# Patient Record
Sex: Female | Born: 1985 | Race: Asian | Hispanic: No | Marital: Married | Smoking: Never smoker
Health system: Southern US, Community
[De-identification: ages and names within clinical notes are randomized; demographics above are authoritative.]

## PROBLEM LIST (undated history)

## (undated) DIAGNOSIS — K649 Unspecified hemorrhoids: Secondary | ICD-10-CM

## (undated) DIAGNOSIS — B019 Varicella without complication: Secondary | ICD-10-CM

## (undated) DIAGNOSIS — Z789 Other specified health status: Secondary | ICD-10-CM

## (undated) DIAGNOSIS — L709 Acne, unspecified: Secondary | ICD-10-CM

---

## 2010-03-26 ENCOUNTER — Inpatient Hospital Stay (HOSPITAL_COMMUNITY): Admission: AD | Admit: 2010-03-26 | Discharge: 2010-03-26 | Payer: Self-pay | Admitting: Obstetrics and Gynecology

## 2010-08-02 ENCOUNTER — Inpatient Hospital Stay (HOSPITAL_COMMUNITY): Admission: AD | Admit: 2010-08-02 | Discharge: 2010-08-02 | Payer: Self-pay | Admitting: Obstetrics and Gynecology

## 2010-08-08 ENCOUNTER — Inpatient Hospital Stay (HOSPITAL_COMMUNITY): Admission: AD | Admit: 2010-08-08 | Discharge: 2010-08-08 | Payer: Self-pay | Admitting: Obstetrics and Gynecology

## 2010-08-15 ENCOUNTER — Inpatient Hospital Stay (HOSPITAL_COMMUNITY): Admission: RE | Admit: 2010-08-15 | Discharge: 2010-08-18 | Payer: Self-pay | Admitting: Obstetrics and Gynecology

## 2011-01-04 LAB — CROSSMATCH
ABO/RH(D): A POS
Unit division: 0
Unit division: 0

## 2011-01-04 LAB — CBC
HCT: 17.9 % — ABNORMAL LOW (ref 36.0–46.0)
HCT: 36.1 % (ref 36.0–46.0)
Hemoglobin: 12.4 g/dL (ref 12.0–15.0)
Hemoglobin: 6 g/dL — CL (ref 12.0–15.0)
MCH: 32.3 pg (ref 26.0–34.0)
MCHC: 34.3 g/dL (ref 30.0–36.0)
MCHC: 34.5 g/dL (ref 30.0–36.0)
MCV: 94.1 fL (ref 78.0–100.0)
MCV: 95.2 fL (ref 78.0–100.0)
MCV: 95.5 fL (ref 78.0–100.0)
Platelets: 131 10*3/uL — ABNORMAL LOW (ref 150–400)
Platelets: 169 10*3/uL (ref 150–400)
RDW: 14.6 % (ref 11.5–15.5)
WBC: 12 10*3/uL — ABNORMAL HIGH (ref 4.0–10.5)

## 2011-01-04 LAB — SURGICAL PCR SCREEN
MRSA, PCR: NEGATIVE
Staphylococcus aureus: NEGATIVE

## 2011-01-09 LAB — WET PREP, GENITAL

## 2014-10-23 HISTORY — PX: BREAST ENHANCEMENT SURGERY: SHX7

## 2015-03-26 LAB — OB RESULTS CONSOLE GC/CHLAMYDIA
Chlamydia: NEGATIVE
Gonorrhea: NEGATIVE

## 2015-05-11 LAB — OB RESULTS CONSOLE ANTIBODY SCREEN: Antibody Screen: NEGATIVE

## 2015-05-11 LAB — OB RESULTS CONSOLE RUBELLA ANTIBODY, IGM: RUBELLA: IMMUNE

## 2015-05-11 LAB — OB RESULTS CONSOLE ABO/RH: RH TYPE: POSITIVE

## 2015-05-11 LAB — OB RESULTS CONSOLE HIV ANTIBODY (ROUTINE TESTING): HIV: NONREACTIVE

## 2015-05-11 LAB — OB RESULTS CONSOLE HEPATITIS B SURFACE ANTIGEN: Hepatitis B Surface Ag: NEGATIVE

## 2015-08-10 LAB — OB RESULTS CONSOLE RPR: RPR: NONREACTIVE

## 2015-10-08 LAB — OB RESULTS CONSOLE GBS: GBS: NEGATIVE

## 2015-11-02 ENCOUNTER — Other Ambulatory Visit: Payer: Self-pay | Admitting: Obstetrics and Gynecology

## 2015-11-03 ENCOUNTER — Encounter (HOSPITAL_COMMUNITY): Payer: Self-pay

## 2015-11-03 NOTE — Patient Instructions (Signed)
Your procedure is scheduled on:  Monday, Jan. 16, 2017  [facility] through [facility] at:  10:30 A.M.  Pick up the phone at the desk and dial [phone].  Call this number if you have problems the morning of surgery: [phone].  Remember: Do NOT eat food:  After Midnight Sunday Do NOT drink clear liquids after:  8:00 A.M. Day of surgery Take these medicines the morning of surgery with a SIP OF WATER:  None  Do NOT wear jewelry (body piercing), metal hair clips/bobby pins, or nail polish. Do NOT wear lotions, powders, or perfumes.  You may wear deoderant. Do NOT shave for 48 hours prior to surgery. Do NOT bring valuables to the hospital.  Leave suitcase in car.  After surgery it may be brought to your room.  For patients admitted to the hospital, checkout time is 11:00 AM the day of discharge.

## 2015-11-05 ENCOUNTER — Inpatient Hospital Stay (HOSPITAL_COMMUNITY): Payer: BLUE CROSS/BLUE SHIELD | Admitting: Anesthesiology

## 2015-11-05 ENCOUNTER — Encounter (HOSPITAL_COMMUNITY): Payer: Self-pay | Admitting: *Deleted

## 2015-11-05 ENCOUNTER — Encounter (HOSPITAL_COMMUNITY)
Admission: AD | Disposition: A | Discharge status: Home / Self Care | Payer: Self-pay | Source: Ambulatory Visit | Attending: Obstetrics and Gynecology

## 2015-11-05 ENCOUNTER — Inpatient Hospital Stay (HOSPITAL_COMMUNITY)
Admission: RE | Admit: 2015-11-05 | Discharge: 2015-11-05 | Disposition: A | Discharge status: Home / Self Care | Payer: Self-pay | Source: Ambulatory Visit

## 2015-11-05 ENCOUNTER — Inpatient Hospital Stay (HOSPITAL_COMMUNITY)
Admission: AD | Admit: 2015-11-05 | Discharge: 2015-11-07 | DRG: 766 | Disposition: A | Discharge status: Home / Self Care | Payer: BLUE CROSS/BLUE SHIELD | Source: Ambulatory Visit | Attending: Obstetrics and Gynecology | Admitting: Obstetrics and Gynecology

## 2015-11-05 DIAGNOSIS — O4292 Full-term premature rupture of membranes, unspecified as to length of time between rupture and onset of labor: Secondary | ICD-10-CM | POA: Diagnosis present

## 2015-11-05 DIAGNOSIS — O34211 Maternal care for low transverse scar from previous cesarean delivery: Secondary | ICD-10-CM | POA: Diagnosis present

## 2015-11-05 DIAGNOSIS — Z3A39 39 weeks gestation of pregnancy: Secondary | ICD-10-CM

## 2015-11-05 DIAGNOSIS — Z302 Encounter for sterilization: Secondary | ICD-10-CM | POA: Diagnosis not present

## 2015-11-05 HISTORY — DX: Varicella without complication: B01.9

## 2015-11-05 HISTORY — DX: Unspecified hemorrhoids: K64.9

## 2015-11-05 HISTORY — DX: Acne, unspecified: L70.9

## 2015-11-05 HISTORY — DX: Other specified health status: Z78.9

## 2015-11-05 LAB — CBC
HCT: 36.5 % (ref 36.0–46.0)
Hemoglobin: 12 g/dL (ref 12.0–15.0)
MCH: 30.7 pg (ref 26.0–34.0)
MCHC: 32.9 g/dL (ref 30.0–36.0)
MCV: 93.4 fL (ref 78.0–100.0)
PLATELETS: 205 10*3/uL (ref 150–400)
RBC: 3.91 MIL/uL (ref 3.87–5.11)
RDW: 13.8 % (ref 11.5–15.5)
WBC: 9 10*3/uL (ref 4.0–10.5)

## 2015-11-05 LAB — TYPE AND SCREEN
ABO/RH(D): A POS
ANTIBODY SCREEN: NEGATIVE

## 2015-11-05 SURGERY — Surgical Case
Anesthesia: Spinal

## 2015-11-05 MED ORDER — DIBUCAINE 1 % RE OINT: 1.0000 "application " | TOPICAL_OINTMENT | RECTAL | Status: DC | PRN

## 2015-11-05 MED ORDER — SODIUM CHLORIDE 0.9 % IR SOLN
Status: DC | PRN
Start: 2015-11-05 — End: 2015-11-05
  Administered 2015-11-05: 1000 mL

## 2015-11-05 MED ORDER — FLEET ENEMA 7-19 GM/118ML RE ENEM: 1.0000 | ENEMA | RECTAL | Status: DC | PRN

## 2015-11-05 MED ORDER — LANOLIN HYDROUS EX OINT: 1.0000 "application " | TOPICAL_OINTMENT | CUTANEOUS | Status: DC | PRN

## 2015-11-05 MED ORDER — PHENYLEPHRINE 40 MCG/ML (10ML) SYRINGE FOR IV PUSH (FOR BLOOD PRESSURE SUPPORT)
PREFILLED_SYRINGE | INTRAVENOUS | Status: AC
  Filled 2015-11-05: qty 10

## 2015-11-05 MED ORDER — PRENATAL MULTIVITAMIN CH
1.0000 | ORAL_TABLET | Freq: Every day | ORAL | Status: DC
  Administered 2015-11-06 – 2015-11-07 (×2): 1 via ORAL
  Filled 2015-11-05 (×2): qty 1

## 2015-11-05 MED ORDER — OXYTOCIN 10 UNIT/ML IJ SOLN: 2.5000 [IU]/h | INTRAVENOUS | Status: AC

## 2015-11-05 MED ORDER — IBUPROFEN 600 MG PO TABS
600.0000 mg | ORAL_TABLET | Freq: Four times a day (QID) | ORAL | Status: DC
  Administered 2015-11-06 – 2015-11-07 (×7): 600 mg via ORAL
  Filled 2015-11-05 (×7): qty 1

## 2015-11-05 MED ORDER — NALBUPHINE HCL 10 MG/ML IJ SOLN: 5.0000 mg | Freq: Once | INTRAMUSCULAR | Status: DC | PRN

## 2015-11-05 MED ORDER — LACTATED RINGERS IV SOLN: 500.0000 mL | INTRAVENOUS | Status: DC | PRN

## 2015-11-05 MED ORDER — METOCLOPRAMIDE HCL 5 MG/ML IJ SOLN
INTRAMUSCULAR | Status: AC
  Filled 2015-11-05: qty 2

## 2015-11-05 MED ORDER — CITRIC ACID-SODIUM CITRATE 334-500 MG/5ML PO SOLN
30.0000 mL | ORAL | Status: DC | PRN
  Administered 2015-11-05: 30 mL via ORAL
  Filled 2015-11-05: qty 15

## 2015-11-05 MED ORDER — MORPHINE SULFATE (PF) 0.5 MG/ML IJ SOLN
INTRAMUSCULAR | Status: AC
  Filled 2015-11-05: qty 10

## 2015-11-05 MED ORDER — ONDANSETRON HCL 4 MG/2ML IJ SOLN
INTRAMUSCULAR | Status: AC
  Filled 2015-11-05: qty 2

## 2015-11-05 MED ORDER — TETANUS-DIPHTH-ACELL PERTUSSIS 5-2.5-18.5 LF-MCG/0.5 IM SUSP: 0.5000 mL | Freq: Once | INTRAMUSCULAR | Status: DC

## 2015-11-05 MED ORDER — NALBUPHINE HCL 10 MG/ML IJ SOLN
5.0000 mg | Freq: Once | INTRAMUSCULAR | Status: DC | PRN
  Filled 2015-11-05: qty 1

## 2015-11-05 MED ORDER — LACTATED RINGERS IV SOLN
INTRAVENOUS | Status: DC
  Administered 2015-11-05: 12:00:00 via INTRAVENOUS

## 2015-11-05 MED ORDER — SIMETHICONE 80 MG PO CHEW
80.0000 mg | CHEWABLE_TABLET | Freq: Three times a day (TID) | ORAL | Status: DC
  Administered 2015-11-06 – 2015-11-07 (×3): 80 mg via ORAL
  Filled 2015-11-05 (×3): qty 1

## 2015-11-05 MED ORDER — ACETAMINOPHEN 325 MG PO TABS
650.0000 mg | ORAL_TABLET | ORAL | Status: DC | PRN
  Administered 2015-11-06: 650 mg via ORAL
  Filled 2015-11-05: qty 2

## 2015-11-05 MED ORDER — ONDANSETRON HCL 4 MG/2ML IJ SOLN: 4.0000 mg | Freq: Three times a day (TID) | INTRAMUSCULAR | Status: DC | PRN

## 2015-11-05 MED ORDER — KETOROLAC TROMETHAMINE 30 MG/ML IJ SOLN: 30.0000 mg | Freq: Four times a day (QID) | INTRAMUSCULAR | Status: AC | PRN

## 2015-11-05 MED ORDER — NALOXONE HCL 2 MG/2ML IJ SOSY: 1.0000 ug/kg/h | PREFILLED_SYRINGE | INTRAVENOUS | Status: DC | PRN

## 2015-11-05 MED ORDER — NALBUPHINE HCL 10 MG/ML IJ SOLN
5.0000 mg | INTRAMUSCULAR | Status: DC | PRN
  Administered 2015-11-05 – 2015-11-06 (×2): 5 mg via SUBCUTANEOUS
  Filled 2015-11-05: qty 1

## 2015-11-05 MED ORDER — SCOPOLAMINE 1 MG/3DAYS TD PT72
MEDICATED_PATCH | TRANSDERMAL | Status: DC | PRN
  Administered 2015-11-05: 1 via TRANSDERMAL

## 2015-11-05 MED ORDER — KETOROLAC TROMETHAMINE 30 MG/ML IJ SOLN
30.0000 mg | Freq: Four times a day (QID) | INTRAMUSCULAR | Status: AC | PRN
  Administered 2015-11-05: 30 mg via INTRAMUSCULAR

## 2015-11-05 MED ORDER — OXYTOCIN 40 UNITS IN LACTATED RINGERS INFUSION - SIMPLE MED
INTRAVENOUS | Status: DC | PRN
  Administered 2015-11-05: 40 [IU] via INTRAVENOUS

## 2015-11-05 MED ORDER — OXYCODONE-ACETAMINOPHEN 5-325 MG PO TABS
1.0000 | ORAL_TABLET | ORAL | Status: DC | PRN
  Administered 2015-11-06 (×2): 1 via ORAL
  Filled 2015-11-05 (×2): qty 1

## 2015-11-05 MED ORDER — ZOLPIDEM TARTRATE 5 MG PO TABS: 5.0000 mg | ORAL_TABLET | Freq: Every evening | ORAL | Status: DC | PRN

## 2015-11-05 MED ORDER — OXYCODONE-ACETAMINOPHEN 5-325 MG PO TABS
2.0000 | ORAL_TABLET | ORAL | Status: DC | PRN
  Administered 2015-11-06 – 2015-11-07 (×4): 2 via ORAL
  Filled 2015-11-05 (×4): qty 2

## 2015-11-05 MED ORDER — CEFAZOLIN SODIUM-DEXTROSE 2-3 GM-% IV SOLR
2.0000 g | INTRAVENOUS | Status: AC
  Administered 2015-11-05: 2 g via INTRAVENOUS

## 2015-11-05 MED ORDER — DIPHENHYDRAMINE HCL 50 MG/ML IJ SOLN: 12.5000 mg | INTRAMUSCULAR | Status: DC | PRN

## 2015-11-05 MED ORDER — KETOROLAC TROMETHAMINE 30 MG/ML IJ SOLN
INTRAMUSCULAR | Status: AC
  Filled 2015-11-05: qty 1

## 2015-11-05 MED ORDER — FENTANYL CITRATE (PF) 100 MCG/2ML IJ SOLN
INTRAMUSCULAR | Status: AC
  Filled 2015-11-05: qty 2

## 2015-11-05 MED ORDER — PHENYLEPHRINE 8 MG IN D5W 100 ML (0.08MG/ML) PREMIX OPTIME
INJECTION | INTRAVENOUS | Status: AC
  Filled 2015-11-05: qty 100

## 2015-11-05 MED ORDER — MENTHOL 3 MG MT LOZG: 1.0000 | LOZENGE | OROMUCOSAL | Status: DC | PRN

## 2015-11-05 MED ORDER — LIDOCAINE HCL (PF) 1 % IJ SOLN: 30.0000 mL | INTRAMUSCULAR | Status: DC | PRN

## 2015-11-05 MED ORDER — OXYCODONE-ACETAMINOPHEN 5-325 MG PO TABS: 2.0000 | ORAL_TABLET | ORAL | Status: DC | PRN

## 2015-11-05 MED ORDER — NALOXONE HCL 0.4 MG/ML IJ SOLN: 0.4000 mg | INTRAMUSCULAR | Status: DC | PRN

## 2015-11-05 MED ORDER — METOCLOPRAMIDE HCL 5 MG/ML IJ SOLN
INTRAMUSCULAR | Status: DC | PRN
  Administered 2015-11-05: 5 mg via INTRAVENOUS

## 2015-11-05 MED ORDER — HYDROMORPHONE HCL 1 MG/ML IJ SOLN: 0.2500 mg | INTRAMUSCULAR | Status: DC | PRN

## 2015-11-05 MED ORDER — MORPHINE SULFATE (PF) 0.5 MG/ML IJ SOLN
INTRAMUSCULAR | Status: DC | PRN
  Administered 2015-11-05: .2 mg via EPIDURAL

## 2015-11-05 MED ORDER — DIPHENHYDRAMINE HCL 25 MG PO CAPS: 25.0000 mg | ORAL_CAPSULE | Freq: Four times a day (QID) | ORAL | Status: DC | PRN

## 2015-11-05 MED ORDER — CEFAZOLIN SODIUM-DEXTROSE 2-3 GM-% IV SOLR
INTRAVENOUS | Status: AC
  Filled 2015-11-05: qty 50

## 2015-11-05 MED ORDER — LACTATED RINGERS IV SOLN
INTRAVENOUS | Status: DC
  Administered 2015-11-06 (×2): via INTRAVENOUS

## 2015-11-05 MED ORDER — ONDANSETRON HCL 4 MG/2ML IJ SOLN
4.0000 mg | Freq: Four times a day (QID) | INTRAMUSCULAR | Status: DC | PRN
  Administered 2015-11-05: 4 mg via INTRAVENOUS

## 2015-11-05 MED ORDER — IBUPROFEN 600 MG PO TABS
600.0000 mg | ORAL_TABLET | Freq: Four times a day (QID) | ORAL | Status: DC | PRN
Start: 2015-11-05 — End: 2015-11-05

## 2015-11-05 MED ORDER — OXYTOCIN BOLUS FROM INFUSION
500.0000 mL | INTRAVENOUS | Status: DC
Start: 2015-11-05 — End: 2015-11-05

## 2015-11-05 MED ORDER — FENTANYL CITRATE (PF) 100 MCG/2ML IJ SOLN
INTRAMUSCULAR | Status: DC | PRN
  Administered 2015-11-05: 20 ug via INTRAVENOUS

## 2015-11-05 MED ORDER — PROMETHAZINE HCL 25 MG/ML IJ SOLN: 6.2500 mg | INTRAMUSCULAR | Status: DC | PRN

## 2015-11-05 MED ORDER — LACTATED RINGERS IV SOLN: 2.5000 [IU]/h | INTRAVENOUS | Status: DC

## 2015-11-05 MED ORDER — LACTATED RINGERS IV SOLN
INTRAVENOUS | Status: DC | PRN
Start: 2015-11-05 — End: 2015-11-05
  Administered 2015-11-05: 17:00:00 via INTRAVENOUS

## 2015-11-05 MED ORDER — OXYCODONE-ACETAMINOPHEN 5-325 MG PO TABS: 1.0000 | ORAL_TABLET | ORAL | Status: DC | PRN

## 2015-11-05 MED ORDER — OXYTOCIN 10 UNIT/ML IJ SOLN
INTRAMUSCULAR | Status: AC
  Filled 2015-11-05: qty 4

## 2015-11-05 MED ORDER — BUPIVACAINE IN DEXTROSE 0.75-8.25 % IT SOLN
INTRATHECAL | Status: DC | PRN
  Administered 2015-11-05: 1.5 mL via INTRATHECAL

## 2015-11-05 MED ORDER — DIPHENHYDRAMINE HCL 25 MG PO CAPS: 25.0000 mg | ORAL_CAPSULE | ORAL | Status: DC | PRN

## 2015-11-05 MED ORDER — ACETAMINOPHEN 325 MG PO TABS: 650.0000 mg | ORAL_TABLET | ORAL | Status: DC | PRN

## 2015-11-05 MED ORDER — PHENYLEPHRINE 8 MG IN D5W 100 ML (0.08MG/ML) PREMIX OPTIME
INJECTION | INTRAVENOUS | Status: DC | PRN
  Administered 2015-11-05: 60 ug/min via INTRAVENOUS

## 2015-11-05 MED ORDER — LACTATED RINGERS IV SOLN
INTRAVENOUS | Status: DC | PRN
  Administered 2015-11-05 (×3): via INTRAVENOUS

## 2015-11-05 MED ORDER — SIMETHICONE 80 MG PO CHEW
80.0000 mg | CHEWABLE_TABLET | ORAL | Status: DC
  Administered 2015-11-06: 80 mg via ORAL
  Filled 2015-11-05: qty 1

## 2015-11-05 MED ORDER — SIMETHICONE 80 MG PO CHEW: 80.0000 mg | CHEWABLE_TABLET | ORAL | Status: DC | PRN

## 2015-11-05 MED ORDER — SODIUM CHLORIDE 0.9 % IJ SOLN: 3.0000 mL | INTRAMUSCULAR | Status: DC | PRN

## 2015-11-05 MED ORDER — MEPERIDINE HCL 25 MG/ML IJ SOLN: 6.2500 mg | INTRAMUSCULAR | Status: DC | PRN

## 2015-11-05 MED ORDER — SCOPOLAMINE 1 MG/3DAYS TD PT72: 1.0000 | MEDICATED_PATCH | Freq: Once | TRANSDERMAL | Status: DC

## 2015-11-05 MED ORDER — WITCH HAZEL-GLYCERIN EX PADS: 1.0000 "application " | MEDICATED_PAD | CUTANEOUS | Status: DC | PRN

## 2015-11-05 MED ORDER — NALBUPHINE HCL 10 MG/ML IJ SOLN: 5.0000 mg | INTRAMUSCULAR | Status: DC | PRN

## 2015-11-05 MED ORDER — SENNOSIDES-DOCUSATE SODIUM 8.6-50 MG PO TABS
2.0000 | ORAL_TABLET | ORAL | Status: DC
  Administered 2015-11-06: 2 via ORAL
  Filled 2015-11-05: qty 2

## 2015-11-05 SURGICAL SUPPLY — 34 items
CLAMP CORD UMBIL (MISCELLANEOUS) IMPLANT
CLIP FILSHIE TUBAL LIGA STRL (Clip) ×3 IMPLANT
CLOTH BEACON ORANGE TIMEOUT ST (SAFETY) ×3 IMPLANT
DRAPE SHEET LG 3/4 BI-LAMINATE (DRAPES) IMPLANT
DRSG OPSITE POSTOP 4X10 (GAUZE/BANDAGES/DRESSINGS) ×3 IMPLANT
DURAPREP 26ML APPLICATOR (WOUND CARE) ×3 IMPLANT
ELECT REM PT RETURN 9FT ADLT (ELECTROSURGICAL) ×3
ELECTRODE REM PT RTRN 9FT ADLT (ELECTROSURGICAL) ×1 IMPLANT
EXTRACTOR VACUUM M CUP 4 TUBE (SUCTIONS) IMPLANT
EXTRACTOR VACUUM M CUP 4' TUBE (SUCTIONS)
GLOVE BIOGEL PI IND STRL 6.5 (GLOVE) ×1 IMPLANT
GLOVE BIOGEL PI IND STRL 7.0 (GLOVE) ×1 IMPLANT
GLOVE BIOGEL PI INDICATOR 6.5 (GLOVE) ×2
GLOVE BIOGEL PI INDICATOR 7.0 (GLOVE) ×2
GLOVE ECLIPSE 6.5 STRL STRAW (GLOVE) ×3 IMPLANT
GOWN STRL REUS W/TWL LRG LVL3 (GOWN DISPOSABLE) ×6 IMPLANT
KIT ABG SYR 3ML LUER SLIP (SYRINGE) IMPLANT
NEEDLE HYPO 25X5/8 SAFETYGLIDE (NEEDLE) IMPLANT
NS IRRIG 1000ML POUR BTL (IV SOLUTION) ×3 IMPLANT
PACK C SECTION WH (CUSTOM PROCEDURE TRAY) ×3 IMPLANT
PAD ABD 7.5X8 STRL (GAUZE/BANDAGES/DRESSINGS) IMPLANT
PAD OB MATERNITY 4.3X12.25 (PERSONAL CARE ITEMS) ×3 IMPLANT
PENCIL SMOKE EVAC W/HOLSTER (ELECTROSURGICAL) ×3 IMPLANT
RTRCTR C-SECT PINK 25CM LRG (MISCELLANEOUS) ×3 IMPLANT
SUT CHROMIC 0 CTX 36 (SUTURE) ×3 IMPLANT
SUT MON AB 2-0 CT1 27 (SUTURE) ×3 IMPLANT
SUT MON AB 4-0 PS1 27 (SUTURE) IMPLANT
SUT PDS AB 0 CTX 60 (SUTURE) IMPLANT
SUT PLAIN 2 0 XLH (SUTURE) IMPLANT
SUT VIC AB 0 CTX 36 (SUTURE) ×8
SUT VIC AB 0 CTX36XBRD ANBCTRL (SUTURE) ×4 IMPLANT
SUT VIC AB 4-0 KS 27 (SUTURE) IMPLANT
TOWEL OR 17X24 6PK STRL BLUE (TOWEL DISPOSABLE) ×3 IMPLANT
TRAY FOLEY CATH SILVER 14FR (SET/KITS/TRAYS/PACK) ×3 IMPLANT

## 2015-11-05 NOTE — Anesthesia Procedure Notes (Signed)
Spinal Patient location during procedure: OB Staffing Anesthesiologist: Franne Grip Performed by: anesthesiologist  Preanesthetic Checklist Completed: patient identified, site marked, surgical consent, pre-op evaluation, timeout performed, IV checked, risks and benefits discussed and monitors and equipment checked Spinal Block Patient position: sitting Prep: Betadine and ChloraPrep Patient monitoring: heart rate, continuous pulse ox and blood pressure Approach: midline Location: L3-4 Injection technique: single-shot Needle Needle type: Pencan  Needle gauge: 24 G Needle length: 9 cm Additional Notes Expiration date of kit checked and confirmed. Patient tolerated procedure well, without complications. CSF clear. No paresthesia.

## 2015-11-05 NOTE — Transfer of Care (Signed)
Immediate Anesthesia Transfer of Care Note  Patient: Rebecca Rubio  Procedure(s) Performed: Procedure(s): CESAREAN SECTION WITH BILATERAL TUBAL LIGATION (N/A)  Patient Location: PACU  Anesthesia Type:Spinal  Level of Consciousness: awake, alert  and oriented  Airway & Oxygen Therapy: Patient Spontanous Breathing  Post-op Assessment: Report given to RN and Post -op Vital signs reviewed and stable  Post vital signs: Reviewed and stable  Last Vitals:  Filed Vitals:   11/05/15 1500 11/05/15 1749  BP: 118/72   Pulse: 82   Temp:  36.8 C  Resp: 18     Complications: No apparent anesthesia complications

## 2015-11-05 NOTE — Anesthesia Preprocedure Evaluation (Signed)
Anesthesia Evaluation  Patient identified by MRN, date of birth, ID band Patient awake    Reviewed: Allergy & Precautions, NPO status , Patient's Chart, lab work & pertinent test results  Airway Mallampati: II  TM Distance: >3 FB Neck ROM: Full    Dental no notable dental hx.    Pulmonary neg pulmonary ROS,    Pulmonary exam normal breath sounds clear to auscultation       Cardiovascular negative cardio ROS Normal cardiovascular exam Rhythm:Regular Rate:Normal     Neuro/Psych negative neurological ROS  negative psych ROS   GI/Hepatic negative GI ROS, Neg liver ROS,   Endo/Other  negative endocrine ROS  Renal/GU negative Renal ROS  negative genitourinary   Musculoskeletal negative musculoskeletal ROS (+)   Abdominal   Peds negative pediatric ROS (+)  Hematology negative hematology ROS (+)   Anesthesia Other Findings   Reproductive/Obstetrics negative OB ROS                             Anesthesia Physical Anesthesia Plan  ASA: II  Anesthesia Plan: Spinal   Post-op Pain Management:    Induction: Intravenous  Airway Management Planned:   Additional Equipment:   Intra-op Plan:   Post-operative Plan: Extubation in OR  Informed Consent: I have reviewed the patients History and Physical, chart, labs and discussed the procedure including the risks, benefits and alternatives for the proposed anesthesia with the patient or authorized representative who has indicated his/her understanding and acceptance.   Dental advisory given  Plan Discussed with: CRNA  Anesthesia Plan Comments:         Anesthesia Quick Evaluation

## 2015-11-05 NOTE — H&P (Signed)
30 y.o. [id number] 31  G2P1001 comes in from office with rupture membranes.  Otherwise has good fetal movement and no bleeding.  She ate at 8am.  She has a repeat c/s with BTL planned for Monday.  Past Medical History  Diagnosis Date  . Chicken pox   . Acne   . Hemorrhoids   . Medical history non-contributory     Past Surgical History  Procedure Laterality Date  . Cesarean section      OB History  Gravida Para Term Preterm AB SAB TAB Ectopic Multiple Living  2 1 1       1     # Outcome Date GA Lbr Len/2nd Weight Sex Delivery Anes PTL Lv  2 Current           1 Term               Social History   Social History  . Marital Status: Married    Spouse Name: N/A  . Number of Children: N/A  . Years of Education: N/A   Occupational History  . Not on file.   Social History Main Topics  . Smoking status: Never Smoker   . Smokeless tobacco: Not on file  . Alcohol Use: No  . Drug Use: No  . Sexual Activity: Not on file   Other Topics Concern  . Not on file   Social History Narrative   Review of patient's allergies indicates no known allergies.    Prenatal Transfer Tool  Maternal Diabetes: No Genetic Screening: Declined Maternal Ultrasounds/Referrals: Normal Fetal Ultrasounds or other Referrals:  None Maternal Substance Abuse:  No Significant Maternal Medications:  None Significant Maternal Lab Results: Lab values include: Group B Strep negative  Other PNC: uncomplicated.    Filed Vitals:   11/05/15 1123  BP: 125/79  Pulse: 95  Temp: 98.5 F (36.9 C)  Resp: 18     Lungs/Cor:  NAD Abdomen:  soft, gravid Ex:  no cords, erythema SVE:  closed FHTs:  135, good STV, NST R Toco:  irreg   A/P   Admit for repeat c/s  Unless active labor will plan for c/s with BTL when at least 8 hrs from meal.  Pre-op Ancef 2g  Other routine pre-op care  GBS Neg  Pradeep Beaubrun

## 2015-11-05 NOTE — Lactation Note (Signed)
This note was copied from the chart of Rebecca Krystal Bach. Lactation Consultation Note Initial visit at 5 hours of age.  Mom has baby latched in cradle hold with body twisted.  Encouraged mom to roll baby belly to chest for ear, shoulder and hips to align.  Baby has strong rhythmic sucking appears "chompy" mom declines pain with latch.  Encouraged mom to allow cheeks chin and nose touch the breast for a deep latch.  Mom reports she is able to hand express with colostrum visible prior to latch.  [facility] resources given and discussed.  Encouraged to feed with early cues on demand.  Early newborn behavior discussed.  Mom to call for assist as needed.    Patient Name: Rebecca Rubio ZOXWR'UToday's Date: 11/05/2015 Reason for consult: Initial assessment   Maternal Data Has patient been taught Hand Expression?: Yes Does the patient have breastfeeding experience prior to this delivery?: Yes  Feeding Feeding Type: Breast Fed Length of feed:  (20 minutes and continues)  LATCH Score/Interventions Latch: Grasps breast easily, tongue down, lips flanged, rhythmical sucking.  Audible Swallowing: A few with stimulation Intervention(s): Skin to skin;Hand expression;Alternate breast massage  Type of Nipple: Everted at rest and after stimulation  Comfort (Breast/Nipple): Soft / non-tender     Hold (Positioning): Assistance needed to correctly position infant at breast and maintain latch.  LATCH Score: 8  Lactation Tools Discussed/Used     Consult Status Consult Status: Follow-up Date: 11/06/15 Follow-up type: In-patient    [name], [name] 11/05/2015, 10:27 PM

## 2015-11-05 NOTE — Brief Op Note (Signed)
11/05/2015  5:42 PM  PATIENT:  Rebecca Rubio  30 y.o. female  PRE-OPERATIVE DIAGNOSIS:  Elective Repeat, Desires Sterilization, SROM  POST-OPERATIVE DIAGNOSIS:  Elective Repeat, Desires Sterilization, SROM  PROCEDURE:  Procedure(s): CESAREAN SECTION WITH BILATERAL TUBAL LIGATION (N/A)  SURGEON:  Surgeon(s) and Role:    Philip Aspen* Carrie Usery, DO - Primary  ANESTHESIA:   spinal  EBL:  Total I/O In: 3300 [I.V.:3300] Out: 1100 [Urine:350; Blood:750]  SPECIMEN:  Source of Specimen:  cord blood  DISPOSITION OF SPECIMEN:  N/A  COUNTS:  YES  PLAN OF CARE: Admit to inpatient   PATIENT DISPOSITION:  PACU - hemodynamically stable.   Delay start of Pharmacological VTE agent (>24hrs) due to surgical blood loss or risk of bleeding: not applicable  FINDINGS: female infant, cephalic presentation, APGARS 8/9, wt pending

## 2015-11-06 LAB — CBC
HEMATOCRIT: 31 % — AB (ref 36.0–46.0)
HEMOGLOBIN: 10.1 g/dL — AB (ref 12.0–15.0)
MCH: 30.6 pg (ref 26.0–34.0)
MCHC: 32.6 g/dL (ref 30.0–36.0)
MCV: 93.9 fL (ref 78.0–100.0)
Platelets: 171 10*3/uL (ref 150–400)
RBC: 3.3 MIL/uL — ABNORMAL LOW (ref 3.87–5.11)
RDW: 13.8 % (ref 11.5–15.5)
WBC: 11.8 10*3/uL — ABNORMAL HIGH (ref 4.0–10.5)

## 2015-11-06 LAB — RPR: RPR: NONREACTIVE

## 2015-11-06 NOTE — Anesthesia Postprocedure Evaluation (Signed)
Anesthesia Post Note  Patient: Rebecca Rubio  Procedure(s) Performed: Procedure(s) (LRB): CESAREAN SECTION WITH BILATERAL TUBAL LIGATION (N/A)  Patient location during evaluation: Mother Baby Anesthesia Type: Spinal Level of consciousness: awake, awake and alert, oriented and patient cooperative Pain management: pain level controlled Vital Signs Assessment: post-procedure vital signs reviewed and stable Respiratory status: spontaneous breathing, nonlabored ventilation and respiratory function stable Cardiovascular status: stable Postop Assessment: no headache, no backache, patient able to bend at knees and no signs of nausea or vomiting Anesthetic complications: no    Last Vitals:  Filed Vitals:   11/06/15 0145 11/06/15 0528  BP: 118/54 115/54  Pulse: 90 79  Temp:  36.8 C  Resp:  18    Last Pain:  Filed Vitals:   11/06/15 0641  PainSc: 0-No pain                 Dominique Ressel L

## 2015-11-06 NOTE — Lactation Note (Addendum)
This note was copied from the chart of Rebecca Rubio. Lactation Consultation Note: Experienced BF mom reports baby has been nursing well today. Had some bleeding from nipple yesterday but thought she did not have the baby on the breast right. Doing better today. Baby last fed about 1 hour ago for 20 min- asleep in dad's arms at present. Last LS 8 by RN. No questions at present. To call prn  Patient Name: Rebecca Julianne HandlerWhiskey Dalia ZOXWR'UToday's Date: 11/06/2015 Reason for consult: Follow-up assessment   Maternal Data Formula Feeding for Exclusion: No Does the patient have breastfeeding experience prior to this delivery?: Yes  Feeding  LATCH Score/Interventions  Lactation Tools Discussed/Used     Consult Status Consult Status: PRN    Pamelia HoitWeeks, Pistol Kessenich D 11/06/2015, 3:08 PM

## 2015-11-06 NOTE — Anesthesia Postprocedure Evaluation (Signed)
Anesthesia Post Note  Patient: Rebecca Rubio  Procedure(s) Performed: Procedure(s) (LRB): CESAREAN SECTION WITH BILATERAL TUBAL LIGATION (N/A)  Patient location during evaluation: PACU Anesthesia Type: Spinal Level of consciousness: oriented and awake and alert Pain management: pain level controlled Vital Signs Assessment: post-procedure vital signs reviewed and stable Respiratory status: spontaneous breathing, respiratory function stable and patient connected to nasal cannula oxygen Cardiovascular status: blood pressure returned to baseline and stable Postop Assessment: no headache, no backache and spinal receding Anesthetic complications: no    Last Vitals:  Filed Vitals:   11/06/15 0135 11/06/15 0145  BP: 103/64 118/54  Pulse: 82 90  Temp:    Resp:      Last Pain:  Filed Vitals:   11/06/15 0446  PainSc: Asleep                 [name] J

## 2015-11-06 NOTE — Addendum Note (Signed)
Addendum  created 11/06/15 0721 by Yolonda KidaAlison L Adler Alton, CRNA   Modules edited: Clinical Notes   Clinical Notes:  File: [id number]

## 2015-11-06 NOTE — Progress Notes (Signed)
Patient is eating, ambulating, foley not yet out.  Pain control is good.  Appropriate lochia, no complaints.  Filed Vitals:   11/06/15 0135 11/06/15 0145 11/06/15 0528 11/06/15 0930  BP: 103/64 118/54 115/54 103/45  Pulse: 82 90 79 76  Temp:   98.2 F (36.8 C) 98.7 F (37.1 C)  TempSrc:    Oral  Resp:   18 18  Height:      Weight:      SpO2:   99% 96%    Fundus firm Inc: c/d/i - minimal blood on dressing Ext: no CT  Lab Results  Component Value Date   WBC 11.8* 11/06/2015   HGB 10.1* 11/06/2015   HCT 31.0* 11/06/2015   MCV 93.9 11/06/2015   PLT 171 11/06/2015    --/--/A POS (01/13 1130)  A/P Postop day #1 s/p repeat c/s with BTL Circ with pediatrician Doing well  Routine care.  Expect d/c 1/15.    Philip AspenALLAHAN, Trystian Crisanto

## 2015-11-07 MED ORDER — OXYCODONE-ACETAMINOPHEN 5-325 MG PO TABS: 2.0000 | ORAL_TABLET | ORAL | Status: DC | PRN

## 2015-11-07 NOTE — Discharge Summary (Signed)
Obstetric Discharge Summary Reason for Admission: cesarean section and rupture of membranes Prenatal Procedures: NST Intrapartum Procedures: cesarean: low cervical, transverse and tubal ligation Postpartum Procedures: none Complications-Operative and Postpartum: none HEMOGLOBIN  Date Value Ref Range Status  11/06/2015 10.1* 12.0 - 15.0 g/dL Final   HCT  Date Value Ref Range Status  11/06/2015 31.0* 36.0 - 46.0 % Final    Physical Exam:  General: alert Lochia: appropriate Uterine Fundus: firm Incision: healing well DVT Evaluation: No evidence of DVT seen on physical exam.  Discharge Diagnoses: Term Pregnancy-delivered and history of prior c/s  Discharge Information: Date: 11/07/2015 Activity: pelvic rest Diet: routine Medications: PNV, Ibuprofen and Percocet Condition: stable Instructions: refer to practice specific booklet Discharge to: home Follow-up Information    Follow up with CALLAHAN, SIDNEY, DO. Schedule an appointment as soon as possible for a visit in 1 month.   Specialty:  Obstetrics and Gynecology   Contact information:   [address] [place] [address] [phone]       Newborn Data: Live born female  Birth Weight: 7 lb 6.7 oz (3365 g) APGAR: 8, 9  Home with mother.  Kysha Muralles E 11/07/2015, 9:58 AM

## 2015-11-07 NOTE — Progress Notes (Signed)
Pt without complaints. She would like to go home Plan/ Will discharge.  

## 2015-11-07 NOTE — Lactation Note (Signed)
This note was copied from the chart of Rebecca Rubio. Lactation Consultation Note  Patient Name: Rebecca Rubio WGNFA'OToday's Date: 11/07/2015 Reason for consult: Follow-up assessment Baby is 4042 hours [facility]. Mom reports that BF is going well; that it hurt some in the beginning but now things are fine. Mom reports that she has a personal pump at home (was not sure of brand) & does not have WIC. [facility]. Mom reports no questions at this time.   Maternal Data    Feeding  LATCH Score/Interventions                      Lactation Tools Discussed/Used WIC Program: No   Consult Status Consult Status: Complete    Oneal GroutLaura C Fraida Veldman 11/07/2015, 11:20 AM

## 2015-11-07 NOTE — Op Note (Signed)
NAME:  Rebecca Rubio, Rebecca Rubio            ACCOUNT NO.:  [id number]  MEDICAL RECORD NO.:  [id number]  LOCATION:  9126                          FACILITY:  [facility]  PHYSICIAN:  Philip AspenSidney Amoni Scallan, DO    DATE OF BIRTH:  [birth year]  DATE OF PROCEDURE:  11/05/2015 DATE OF DISCHARGE:                              OPERATIVE REPORT   PREOPERATIVE DIAGNOSIS:  Elective repeat cesarean section with desired permanent sterilization and presentation with spontaneous rupture of membranes.  POSTOPERATIVE DIAGNOSIS:  Elective repeat cesarean section with desired permanent sterilization and presentation with spontaneous rupture of membranes.  PROCEDURE:  Low-transverse cesarean section with bilateral tubal ligation with Filshie clips.  SURGEON:  Philip AspenSidney Kalyn Hofstra, D.O.  ANESTHESIA:  Spinal.  IV FLUIDS:  3300 mL.  URINE OUTPUT:  350 mL.  ESTIMATED BLOOD LOSS:  750 mL.  SPECIMENS:  Cord blood.  COMPLICATIONS:  None.  CONDITION:  Stable to PACU.  FINDINGS:  Female infant with meconium present.  Weight 7 pounds 6 ounces with Apgars of 8 and 9.  She will need adhesions from the anterior uterus involving bladder and anterior abdominal wall.  Normal tubes and ovaries bilaterally.  PROCEDURE IN DETAIL:  The patient was taken to the operating room where spinal anesthesia was administered and found to be adequate.  She was prepped and draped in a normal sterile fashion in dorsal supine position.  A Pfannenstiel skin incision was made with a scalpel and carried down to underlying layer of fascia with Bovie cautery.  The fascia was incised in the midline with the scalpel and extended laterally with Mayo scissors.  Kocher clamps were placed at the superior aspect of the fascial incision, and rectus muscles were dissected off bluntly and sharply.  Kocher clamps were then placed at the anterior aspect of the fascial incision, and again the rectus muscles were dissected off bluntly and sharply.  Hemostat was used to  separate the musculature of the rectus muscles at the midline with identification of the peritoneum.  The peritoneum was entered bluntly and extended slightly by manual lateral traction.  Good visualization of the bladder was noted and dissection of the scarred anterior abdominal wall tissue with rectus muscles was performed carefully to avoid any bladder injury. The peritoneal incision was further extended by lateral traction.  The abdomen was surveyed manually, and no additional scar tissue was found to impede placement of the Alexis self retractor.  The vesicouterine peritoneum was tented with forceps, entered sharply with Metzenbaum scissors, extended laterally, and a bladder flap was developed visually. A low transverse cesarean incision was made and copious meconium came through the incision.  The uterine incision was extended by caudal and cephalic traction.  The infant's head was located easily and delivered through the hysterotomy incision followed by the remainder of the infant's body.  The baby was vigorous, bulb-suctioned.  Cord was clamped and cut, and the infant was handed off to awaiting Neonatology.  Pitocin was started and gentle traction on the umbilical cord with external massage of the uterus delivered the placenta.  The uterine cavity was cleared of all clot and debris, and the uterine incision was closed with Vicryl in a running, locked fashion.  Excellent hemostasis was  noted. The right fallopian tube was identified and elevated with the use of 2 Babcock clamps, followed to its fimbriated end, and a Filshie clip was placed at the mid point.  The left tube was also identified, and a Filshie clip was placed.  On the right side, some minimal bleeding adjacent to the Filshie clip was noted.  A Bovie cautery was used and a loop of chromic suture was placed with excellent hemostasis.  The Alexis self retractor was then removed.  The fascia was reapproximated and closed  with 0 Vicryl in a running fashion.  Subcutaneous tissue was irrigated, dried, and minimal use of Bovie cautery was needed for hemostasis.  Subcutaneous tissue was then reapproximated and closed with 5 interrupted chromic sutures.  Skin was then reapproximated and closed with staples.  The patient tolerated the procedure well.  Sponge, lap, and needle counts were correct x2.  The patient was taken to Recovery in stable condition.          ______________________________ Philip Aspen, DO     [place]/MEDQ  D:  11/06/2015  T:  11/07/2015  Job:  [id number]

## 2015-11-08 ENCOUNTER — Encounter (HOSPITAL_COMMUNITY): Admission: RE | Payer: Self-pay | Source: Ambulatory Visit

## 2015-11-08 ENCOUNTER — Inpatient Hospital Stay (HOSPITAL_COMMUNITY): Admission: RE | Admit: 2015-11-08 | Payer: 59 | Source: Ambulatory Visit | Admitting: Obstetrics and Gynecology

## 2015-11-08 ENCOUNTER — Encounter (HOSPITAL_COMMUNITY): Payer: Self-pay | Admitting: Obstetrics and Gynecology

## 2015-11-08 SURGERY — Surgical Case
Anesthesia: Regional | Laterality: Bilateral

## 2020-02-26 ENCOUNTER — Other Ambulatory Visit: Payer: Self-pay

## 2020-02-26 ENCOUNTER — Ambulatory Visit: Payer: BLUE CROSS/BLUE SHIELD | Attending: Internal Medicine

## 2020-02-26 DIAGNOSIS — Z20822 Contact with and (suspected) exposure to covid-19: Secondary | ICD-10-CM

## 2020-02-27 LAB — NOVEL CORONAVIRUS, NAA: SARS-CoV-2, NAA: NOT DETECTED

## 2020-02-27 LAB — SARS-COV-2, NAA 2 DAY TAT

## 2020-10-24 ENCOUNTER — Emergency Department (HOSPITAL_COMMUNITY)
Admission: EM | Admit: 2020-10-24 | Discharge: 2020-10-24 | Disposition: A | Discharge status: Home / Self Care | Payer: 59 | Attending: Emergency Medicine | Admitting: Emergency Medicine

## 2020-10-24 ENCOUNTER — Other Ambulatory Visit: Payer: Self-pay

## 2020-10-24 ENCOUNTER — Emergency Department (HOSPITAL_COMMUNITY): Payer: 59

## 2020-10-24 ENCOUNTER — Encounter (HOSPITAL_COMMUNITY): Payer: Self-pay | Admitting: Emergency Medicine

## 2020-10-24 DIAGNOSIS — N7011 Chronic salpingitis: Secondary | ICD-10-CM | POA: Diagnosis not present

## 2020-10-24 DIAGNOSIS — R1031 Right lower quadrant pain: Secondary | ICD-10-CM

## 2020-10-24 LAB — BASIC METABOLIC PANEL
Anion gap: 4 — ABNORMAL LOW (ref 5–15)
BUN: 10 mg/dL (ref 6–20)
CO2: 25 mmol/L (ref 22–32)
Calcium: 8.8 mg/dL — ABNORMAL LOW (ref 8.9–10.3)
Chloride: 102 mmol/L (ref 98–111)
Creatinine, Ser: 0.59 mg/dL (ref 0.44–1.00)
GFR, Estimated: >60 mL/min (ref >60)
Glucose, Bld: 110 mg/dL — ABNORMAL HIGH (ref 70–99)
Potassium: 4 mmol/L (ref 3.5–5.1)
Sodium: 131 mmol/L — ABNORMAL LOW (ref 135–145)

## 2020-10-24 LAB — URINALYSIS, ROUTINE W REFLEX MICROSCOPIC
Bilirubin Urine: NEGATIVE
Glucose, UA: NEGATIVE mg/dL
Hgb urine dipstick: NEGATIVE
Ketones, ur: NEGATIVE mg/dL
Leukocytes,Ua: NEGATIVE
Nitrite: NEGATIVE
Protein, ur: NEGATIVE mg/dL
Specific Gravity, Urine: 1.013 (ref 1.005–1.030)
pH: 7 (ref 5.0–8.0)

## 2020-10-24 LAB — WET PREP, GENITAL
Clue Cells Wet Prep HPF POC: NONE SEEN
Sperm: NONE SEEN
Trich, Wet Prep: NONE SEEN
Yeast Wet Prep HPF POC: NONE SEEN

## 2020-10-24 LAB — CBC
HCT: 42.3 % (ref 36.0–46.0)
Hemoglobin: 13.7 g/dL (ref 12.0–15.0)
MCH: 30.7 pg (ref 26.0–34.0)
MCHC: 32.4 g/dL (ref 30.0–36.0)
MCV: 94.8 fL (ref 80.0–100.0)
Platelets: 243 10*3/uL (ref 150–400)
RBC: 4.46 MIL/uL (ref 3.87–5.11)
RDW: 12.8 % (ref 11.5–15.5)
WBC: 6.2 10*3/uL (ref 4.0–10.5)
nRBC: 0 % (ref 0.0–0.2)

## 2020-10-24 LAB — HEPATIC FUNCTION PANEL
ALT: 17 U/L (ref 0–44)
AST: 16 U/L (ref 15–41)
Albumin: 4.5 g/dL (ref 3.5–5.0)
Alkaline Phosphatase: 44 U/L (ref 38–126)
Bilirubin, Direct: <0.1 mg/dL (ref 0.0–0.2)
Total Bilirubin: 0.4 mg/dL (ref 0.3–1.2)
Total Protein: 7.5 g/dL (ref 6.5–8.1)

## 2020-10-24 LAB — POC URINE PREG, ED: Preg Test, Ur: NEGATIVE

## 2020-10-24 MED ORDER — IOHEXOL 300 MG/ML  SOLN
80.0000 mL | Freq: Once | INTRAMUSCULAR | Status: AC | PRN
  Administered 2020-10-24: 80 mL via INTRAVENOUS

## 2020-10-24 MED ORDER — MORPHINE SULFATE (PF) 4 MG/ML IV SOLN
4.0000 mg | Freq: Once | INTRAVENOUS | Status: AC
  Administered 2020-10-24: 4 mg via INTRAVENOUS
  Filled 2020-10-24: qty 1

## 2020-10-24 MED ORDER — HYDROCODONE-ACETAMINOPHEN 5-325 MG PO TABS: 2.0000 | ORAL_TABLET | ORAL | 0 refills | Status: DC | PRN

## 2020-10-24 MED ORDER — HYDROCODONE-ACETAMINOPHEN 5-325 MG PO TABS: 1.0000 | ORAL_TABLET | ORAL | 0 refills | Status: AC | PRN

## 2020-10-24 NOTE — ED Triage Notes (Signed)
Pt states she has right flank pain that became more painful yesterday.   Pt reports dark urine, and the pain is worse when she eats fatty foods.

## 2020-10-24 NOTE — ED Provider Notes (Signed)
[facility] EMERGENCY DEPARTMENT Provider Note   CSN: [id number] Arrival date & time: 10/24/20  1000     History Chief Complaint  Patient presents with  . Flank Pain    Rebecca Rubio is a 35 y.o. female.  HPI 35 year old female with a history of hemorrhoids presents to the ER with right lower quadrant pain which radiates into the back, intermittently over the last 2 weeks, worsening over the last few days.  Pain is sharp and stabbing in nature.  Feels like it is constant now.  Has had one episode of vomiting this morning.  Denies any vaginal bleeding or discharge, but does state that her cycle is a few days late.  No noticeable fevers or chills.  She states that she was told a while back that she does have an ovarian cyst.  She has pending OB/GYN follow-up on November 18, 2020.  She has been taking over-the-counter pain medicines with little relief.  Denies any dysuria, however states urine has been dark.  Pain is worse when she eats fatty foods.  No alleviating factors.  She is sexually active with her husband.    Past Medical History:  Diagnosis Date  . Acne   . Chicken pox   . Hemorrhoids   . Medical history non-contributory     Patient Active Problem List   Diagnosis Date Noted  . Labor and delivery, indication for care 11/05/2015  . Cesarean delivery delivered 11/05/2015    Past Surgical History:  Procedure Laterality Date  . BREAST ENHANCEMENT SURGERY  2016  . CESAREAN SECTION    . CESAREAN SECTION WITH BILATERAL TUBAL LIGATION N/A 11/05/2015   Procedure: CESAREAN SECTION WITH BILATERAL TUBAL LIGATION;  Surgeon: Philip Aspen, DO;  Location: [facility];  Service: Obstetrics;  Laterality: N/A;     OB History    Gravida  2   Para  2   Term  2   Preterm      AB      Living  1     SAB      IAB      Ectopic      Multiple  0   Live Births  1           Family History  Problem Relation Age of Onset  . Cancer Mother   . Diabetes Mother   .  Hypertension Father     Social History   Tobacco Use  . Smoking status: Never Smoker  . Smokeless tobacco: Never Used  Vaping Use  . Vaping Use: Never used  Substance Use Topics  . Alcohol use: Yes    Alcohol/week: 3.0 standard drinks    Types: 3 Glasses of wine per week  . Drug use: No    Home Medications Prior to Admission medications   Medication Sig Start Date End Date Taking? Authorizing Provider  Multiple Vitamin (MULTIVITAMIN ADULT PO) Take 1 tablet by mouth daily.   Yes [provider]  oxyCODONE-acetaminophen (PERCOCET/ROXICET) 5-325 MG tablet Take 2 tablets by mouth every 4 (four) hours as needed (pain scale > 7). Patient taking differently: Take 2 tablets by mouth every 4 (four) hours as needed for moderate pain (pain scale > 7). 11/07/15  Yes Levi Aland, MD  Probiotic Product (PROBIOTIC PO) Take 1 tablet by mouth daily.   Yes [provider]  HYDROcodone-acetaminophen (NORCO/VICODIN) 5-325 MG tablet Take 1 tablet by mouth every 4 (four) hours as needed for up to 3 days. 10/24/20 10/27/20  Garald Balding, PA-C    Allergies    Patient has no known allergies.  Review of Systems   Review of Systems  Constitutional: Negative for chills and fever.  HENT: Negative for ear pain and sore throat.   Eyes: Negative for pain and visual disturbance.  Respiratory: Negative for cough and shortness of breath.   Cardiovascular: Negative for chest pain and palpitations.  Gastrointestinal: Positive for abdominal pain, nausea and vomiting. Negative for blood in stool, constipation and diarrhea.  Genitourinary: Negative for dysuria, hematuria, vaginal bleeding, vaginal discharge and vaginal pain.  Musculoskeletal: Negative for arthralgias and back pain.  Skin: Negative for color change and rash.  Neurological: Negative for seizures and syncope.  All other systems reviewed and are negative.   Physical Exam Updated Vital Signs BP 130/87   Pulse 76   Temp 98.3  F (36.8 C) (Oral)   Resp 17   Ht 4\' 11"  (1.499 m)   Wt 54.4 kg   LMP 09/20/2020 (Approximate)   SpO2 100%   Breastfeeding No   BMI 24.24 kg/m   Physical Exam Vitals and nursing note reviewed. Exam conducted with a chaperone present.  Constitutional:      General: She is not in acute distress.    Appearance: She is well-developed and well-nourished. She is not ill-appearing or diaphoretic.  HENT:     Head: Normocephalic and atraumatic.     Mouth/Throat:     Mouth: Mucous membranes are moist.     Pharynx: Oropharynx is clear.  Eyes:     Conjunctiva/sclera: Conjunctivae normal.  Cardiovascular:     Rate and Rhythm: Normal rate and regular rhythm.     Heart sounds: No murmur heard.   Pulmonary:     Effort: Pulmonary effort is normal. No respiratory distress.     Breath sounds: Normal breath sounds.  Abdominal:     Palpations: Abdomen is soft.     Tenderness: There is abdominal tenderness (Right lower quadrant tenderness on exam). There is right CVA tenderness. There is no left CVA tenderness.  Genitourinary:    Comments: No cervical motion tenderness on exam, mild right adnexal tenderness, none on the right.  Cervix with brownish discharge Musculoskeletal:        General: No edema. Normal range of motion.     Cervical back: Neck supple.  Skin:    General: Skin is warm and dry.  Neurological:     General: No focal deficit present.     Mental Status: She is alert and oriented to person, place, and time.  Psychiatric:        Mood and Affect: Mood and affect normal.     ED Results / Procedures / Treatments   Labs (all labs ordered are listed, but only abnormal results are displayed) Labs Reviewed  WET PREP, GENITAL - Abnormal; Notable for the following components:      Result Value   WBC, Wet Prep HPF POC MANY (*)    All other components within normal limits  BASIC METABOLIC PANEL - Abnormal; Notable for the following components:   Sodium 131 (*)    Glucose, Bld  110 (*)    Calcium 8.8 (*)    Anion gap 4 (*)    All other components within normal limits  URINALYSIS, ROUTINE W REFLEX MICROSCOPIC  CBC  HEPATIC FUNCTION PANEL  POC URINE PREG, ED  GC/CHLAMYDIA PROBE AMP ([place]) NOT AT [facility]    EKG None  Radiology CT ABDOMEN PELVIS W  CONTRAST  Result Date: 10/24/2020 CLINICAL DATA:  35 year old female with history of right lower quadrant abdominal pain with nausea and vomiting for the past week, worsening yesterday. EXAM: CT ABDOMEN AND PELVIS WITH CONTRAST TECHNIQUE: Multidetector CT imaging of the abdomen and pelvis was performed using the standard protocol following bolus administration of intravenous contrast. CONTRAST:  58mL OMNIPAQUE IOHEXOL 300 MG/ML  SOLN COMPARISON:  No priors. FINDINGS: Lower chest: Bilateral breast implants are incidentally noted. Hepatobiliary: Subcentimeter low-attenuation lesion in between segments 7 and 8 of the liver, too small to characterize, but statistically likely to represent a tiny cyst. No other suspicious cystic or solid hepatic lesions. No intra or extrahepatic biliary ductal dilatation. Gallbladder is normal in appearance. Pancreas: No pancreatic mass. No pancreatic ductal dilatation. No pancreatic or peripancreatic fluid collections or inflammatory changes. Spleen: Unremarkable. Adrenals/Urinary Tract: Bilateral kidneys and adrenal glands are normal in appearance. No hydroureteronephrosis. Urinary bladder is normal in appearance. Stomach/Bowel: Normal appearance of the stomach. No pathologic dilatation of small bowel or colon. Normal appendix. Vascular/Lymphatic: No significant atherosclerotic disease, aneurysm or dissection noted in the abdominal or pelvic vasculature. No lymphadenopathy noted in the abdomen or pelvis. Reproductive: Right-sided tubal ligation clip noted. Left-sided tubal ligation clip appears likely dislodged lying dependently in the cul-de-sac. Tubular structure in the presacral region which  appears likely to emanate from the right adnexa, concerning for hydrosalpinx, measuring up to 2.9 cm in diameter (axial image 58 of series 2). Left ovary is grossly unremarkable in appearance. Uterus is normal in appearance. Other: Trace volume of free fluid in the cul-de-sac, likely physiologic. No larger volume of ascites. No pneumoperitoneum. Musculoskeletal: There are no aggressive appearing lytic or blastic lesions noted in the visualized portions of the skeleton. IMPRESSION: 1. Findings are concerning for right-sided hydrosalpinx. Trace volume of free fluid in the cul-de-sac, likely physiologic. These findings could be better evaluated with pelvic ultrasound if clinically appropriate. 2. Normal appendix. Electronically Signed   By: Trudie Reed M.D.   On: 10/24/2020 15:03   US PELVIC COMPLETE W TRANSVAGINAL AND TORSION R/O  Result Date: 10/24/2020 CLINICAL DATA:  Right lower quadrant pain EXAM: TRANSABDOMINAL AND TRANSVAGINAL ULTRASOUND OF PELVIS DOPPLER ULTRASOUND OF OVARIES TECHNIQUE: Both transabdominal and transvaginal ultrasound examinations of the pelvis were performed. Transabdominal technique was performed for global imaging of the pelvis including uterus, ovaries, adnexal regions, and pelvic cul-de-sac. It was necessary to proceed with endovaginal exam following the transabdominal exam to visualize the ovaries. Color and duplex Doppler ultrasound was utilized to evaluate blood flow to the ovaries. COMPARISON:  None. FINDINGS: Uterus Measurements: 8.9 x 5.2 x 4.9 cm = volume: 119 mL. No fibroids or other mass visualized. Endometrium Thickness: 4 mm in thickness.  No focal abnormality visualized. Right ovary Measurements: 1.8 x 2.9 x 1.8 cm = volume: 4.8 mL. Normal appearance/no adnexal mass. Left ovary Measurements: 5.5 x 4.3 x 5.6 cm = volume: 69 mL. Cystic area within the left adnexa measures 4.8 x 4.2 x 2.7 cm, with somewhat tubular appearance. This could reflect ovarian cyst or hydrosalpinx.  Other findings No abnormal free fluid. Pulsed Doppler evaluation of both ovaries demonstrates normal low-resistance arterial and venous waveforms. IMPRESSION: Cystic area within the left adnexa could reflect ovarian cyst or hydrosalpinx measuring up to 4.8 cm. No evidence of torsion. Electronically Signed   By: Charlett Nose M.D.   On: 10/24/2020 15:46    Procedures Procedures (including critical care time)  Medications Ordered in ED Medications  iohexol (OMNIPAQUE) 300 MG/ML  solution 80 mL (80 mLs Intravenous Contrast Given 10/24/20 1445)  morphine 4 MG/ML injection 4 mg (4 mg Intravenous Given 10/24/20 1555)    ED Course  I have reviewed the triage vital signs and the nursing notes.  Pertinent labs & imaging results that were available during my care of the patient were reviewed by me and considered in my medical decision making (see chart for details).  Clinical Course as of 10/24/20 1726  Sun Oct 24, 2020  1614 Dr.  Earlene Plater w/ OBGYN [MB]    Clinical Course User Index [MB] Leone Brand   MDM Rules/Calculators/A&P                          35 year old female with right lower quadrant pain and flank pain.  On arrival, she is alert, oriented, nontoxic-appearing, no acute distress, resting comfortably in the ER bed.  Vitals on arrival overall reassuring.  Exam with mild right lower quadrant tenderness and flank tenderness.  Pelvic exam with some brown discharge from cervix, no cervical motion tenderness, some mild right adnexal tenderness noted.  Labs ordered, reviewed and interpreted by me -BMP with mild hyponatremia 131, creatinine of 0.59.  Hepatic function panel normal.  Pregnancy test is negative.  CBC without leukocytosis.  UA without evidence of infection or blood.  Wet prep with many WBCs.  Ordered, reviewed and interpreted by me and radiology -CT of the abdomen and pelvic ultrasound with hydrosalpinx measuring up to 4.8 cm versus ovarian cyst.  Discussed these findings  with Dr. Earlene Plater with OB/GYN.  There is possible concern for intermittent torsion, however no torsion noted on the pelvic ultrasound currently.  Question transfer to women's center for further evaluation versus pain management and having OB/GYN follow-up this week.  Partook in shared decision making with the patient who would like to go home and try to manage her symptoms of possible.  She is comfortable going home with a short course of pain medicine and following up with OB/GYN this week.  Patient was given 4 mg of morphine, on reevaluation, patient has significant improvement in her pain.  Will send home with short course of Norco.  Stable for discharge and OB/GYN follow-up.  Discussed that if the patient symptoms began to get worse, she should go to the [facility].  Her and her husband at bedside voiced understanding and are agreeable.    Final Clinical Impression(s) / ED Diagnoses Final diagnoses:  RLQ abdominal pain  Hydrosalpinx    Rx / DC Orders ED Discharge Orders         Ordered    HYDROcodone-acetaminophen (NORCO/VICODIN) 5-325 MG tablet  Every 4 hours PRN,   Status:  Discontinued        10/24/20 1725    HYDROcodone-acetaminophen (NORCO/VICODIN) 5-325 MG tablet  Every 4 hours PRN        10/24/20 1726           Leone Brand 10/24/20 1726    Bethann Berkshire, MD 10/28/20 1037

## 2020-10-24 NOTE — Discharge Instructions (Signed)
If your pain becomes severe, please go to the women's hospital for further evaluation.  You may take the prescribed pain medicine as needed for pain.  Make sure to follow-up with OB/GYN this week.

## 2020-10-25 LAB — GC/CHLAMYDIA PROBE AMP (~~LOC~~) NOT AT ARMC
Chlamydia: NEGATIVE
Comment: NEGATIVE
Comment: NORMAL
Neisseria Gonorrhea: NEGATIVE

## 2020-11-19 ENCOUNTER — Other Ambulatory Visit: Payer: Self-pay | Admitting: Obstetrics and Gynecology

## 2020-11-19 DIAGNOSIS — R1011 Right upper quadrant pain: Secondary | ICD-10-CM

## 2020-11-23 ENCOUNTER — Other Ambulatory Visit: Payer: Self-pay | Admitting: Obstetrics and Gynecology

## 2020-11-23 DIAGNOSIS — N644 Mastodynia: Secondary | ICD-10-CM

## 2020-12-02 ENCOUNTER — Ambulatory Visit
Admission: RE | Admit: 2020-12-02 | Discharge: 2020-12-02 | Disposition: A | Discharge status: Home / Self Care | Payer: 59 | Source: Ambulatory Visit | Attending: Obstetrics and Gynecology | Admitting: Obstetrics and Gynecology

## 2020-12-02 DIAGNOSIS — R1011 Right upper quadrant pain: Secondary | ICD-10-CM

## 2021-01-03 ENCOUNTER — Ambulatory Visit
Admission: RE | Admit: 2021-01-03 | Discharge: 2021-01-03 | Disposition: A | Discharge status: Home / Self Care | Payer: BLUE CROSS/BLUE SHIELD | Source: Ambulatory Visit | Attending: Obstetrics and Gynecology | Admitting: Obstetrics and Gynecology

## 2021-01-03 ENCOUNTER — Other Ambulatory Visit: Payer: Self-pay | Admitting: Obstetrics and Gynecology

## 2021-01-03 ENCOUNTER — Other Ambulatory Visit: Payer: Self-pay

## 2021-01-03 DIAGNOSIS — N644 Mastodynia: Secondary | ICD-10-CM

## 2021-07-12 IMAGING — CT CT ABD-PELV W/ CM
2 of 4 series · 16 of 46 positions shown, 18 images · IV contrast (Omnipaque or Isovue)
Comparison: No priors.

CLINICAL DATA: 34-year-old female with history of right lower
quadrant abdominal pain with nausea and vomiting for the past week,
worsening yesterday.

EXAM:
CT ABDOMEN AND PELVIS WITH CONTRAST
TECHNIQUE: Multidetector CT imaging of the abdomen and pelvis was performed
using the standard protocol following bolus administration of
intravenous contrast.
CONTRAST:  80mL OMNIPAQUE IOHEXOL 300 MG/ML  SOLN

[Series 2: axial st · axial · 0.75mm/px · z∈[-548,-173]mm · 13 of 83 slices shown, 15 images]
[im 4/83  soft-tissue]
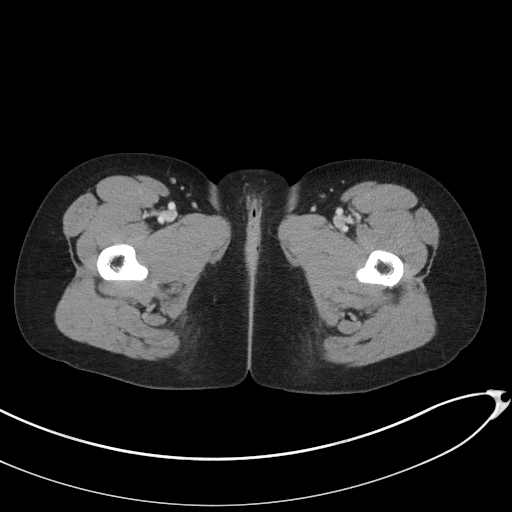
[im 4/83  bone]
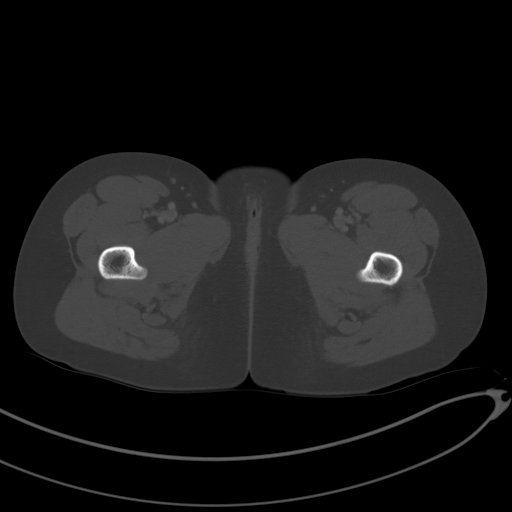
[im 11/83  soft-tissue]
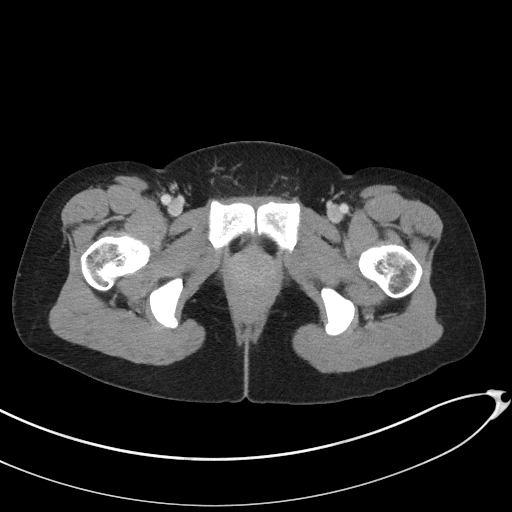
[im 18/83  soft-tissue]
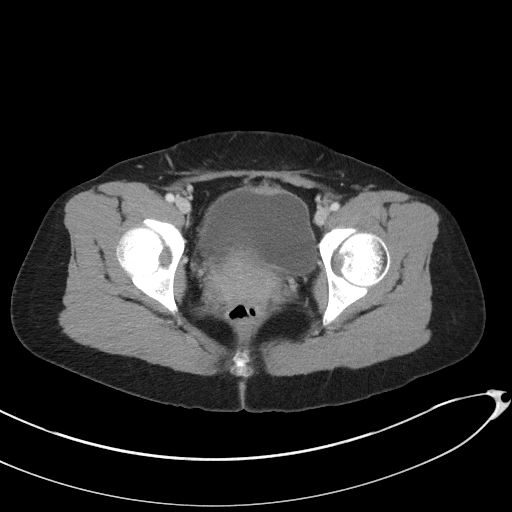
[im 24/83  soft-tissue]
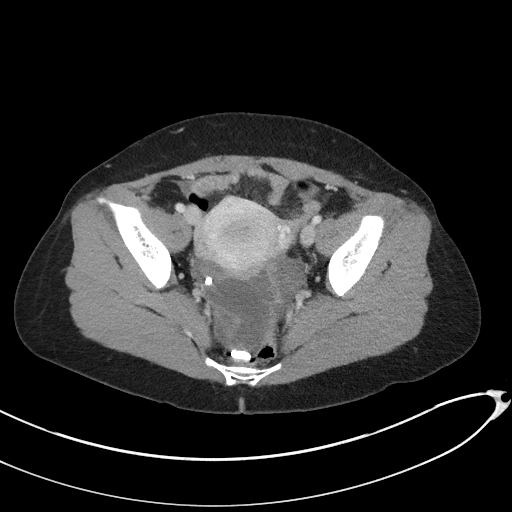
[im 28/83  soft-tissue]
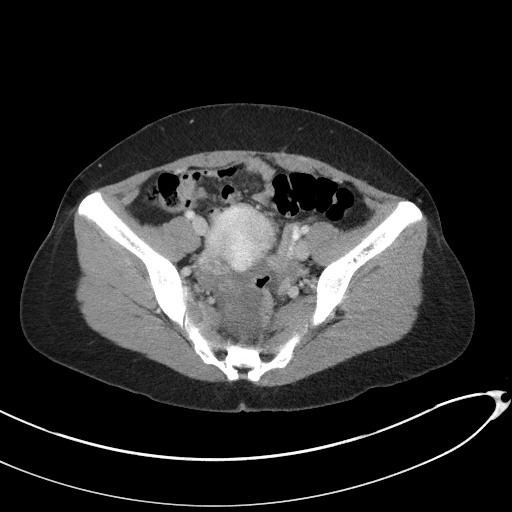
[im 35/83  soft-tissue]
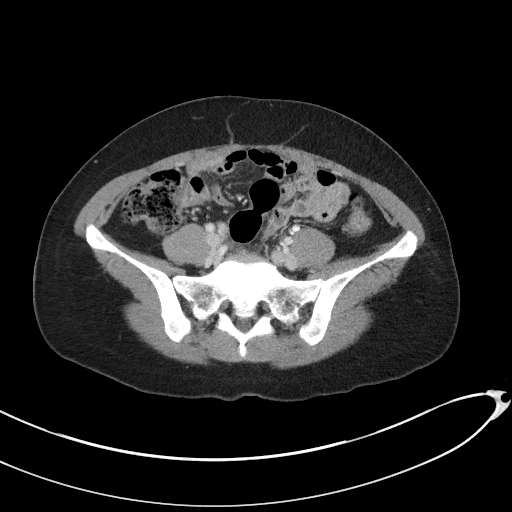
[im 42/83  soft-tissue]
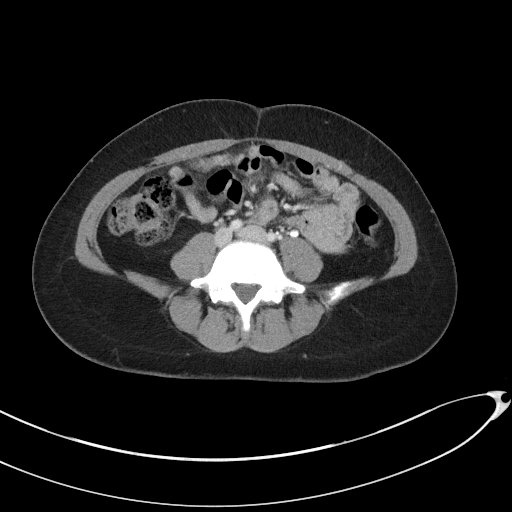
[im 48/83  soft-tissue]
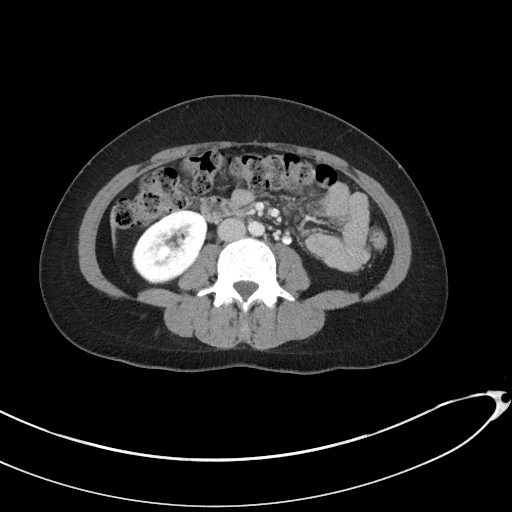
[im 55/83  soft-tissue]
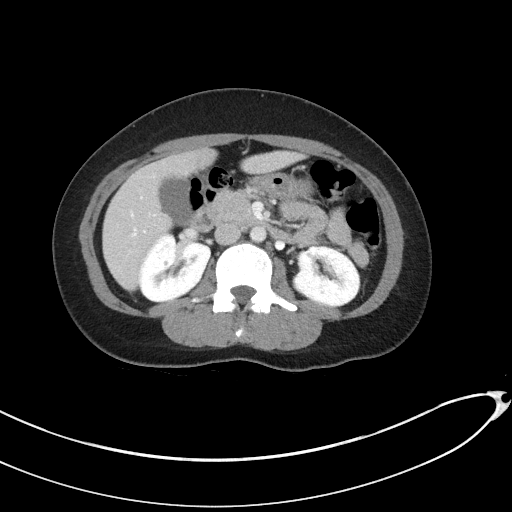
[im 55/83  bone]
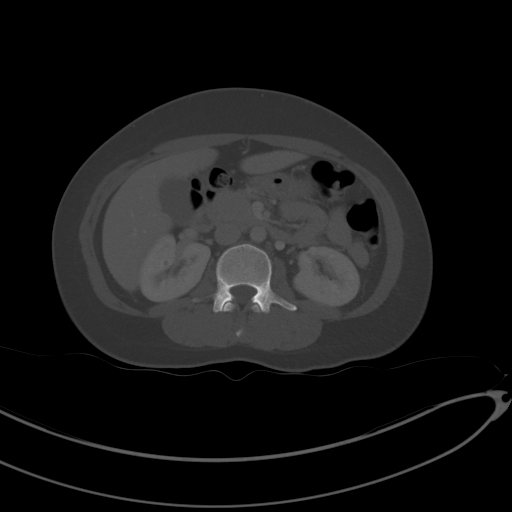
[im 59/83  soft-tissue]
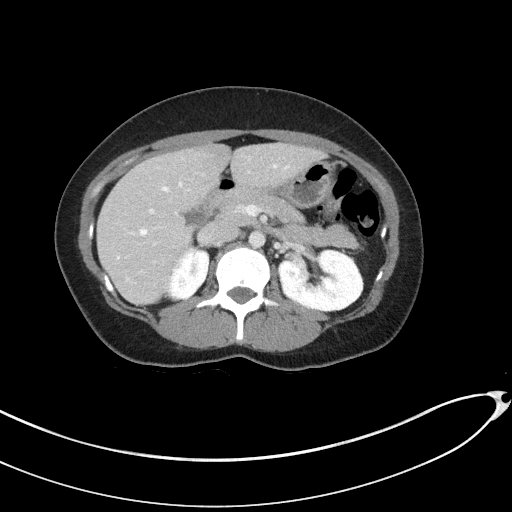
[im 65/83  soft-tissue]
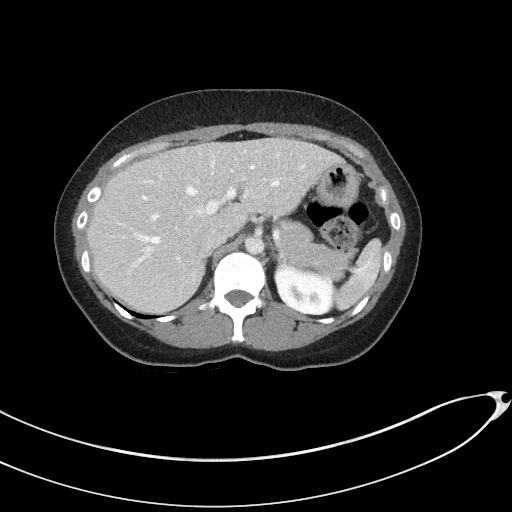
[im 72/83  soft-tissue]
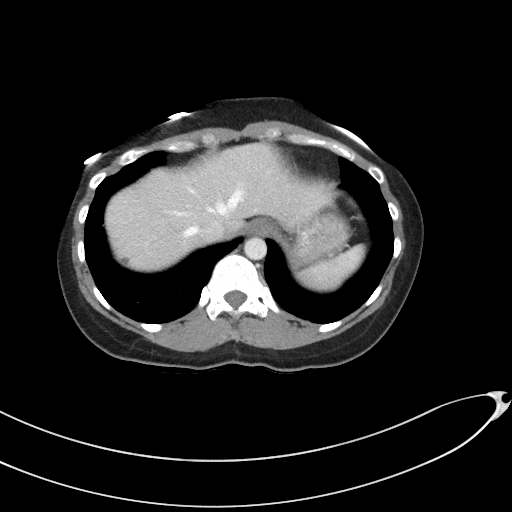
[im 79/83  soft-tissue]
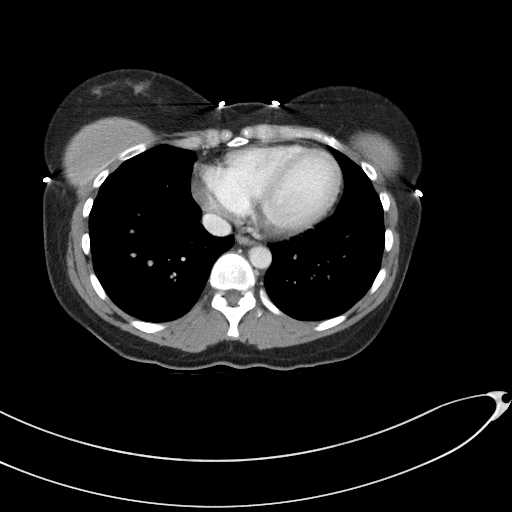

[Series 5: coronal st · coronal · 0.66mm/px · 3 of 90 slices shown]
[im 30/90  soft-tissue]
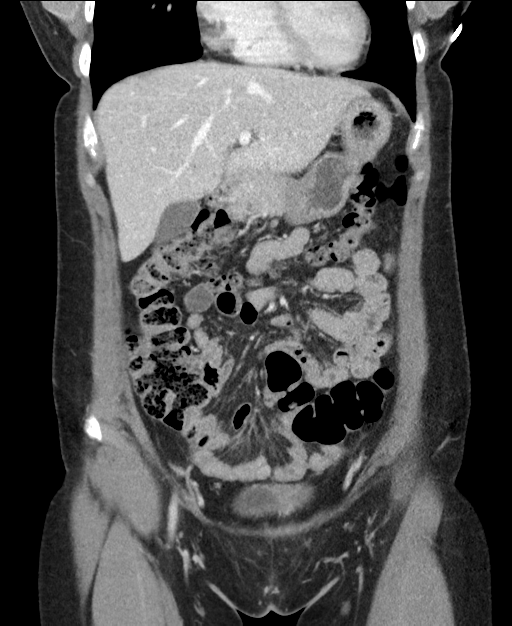
[im 40/90  soft-tissue]
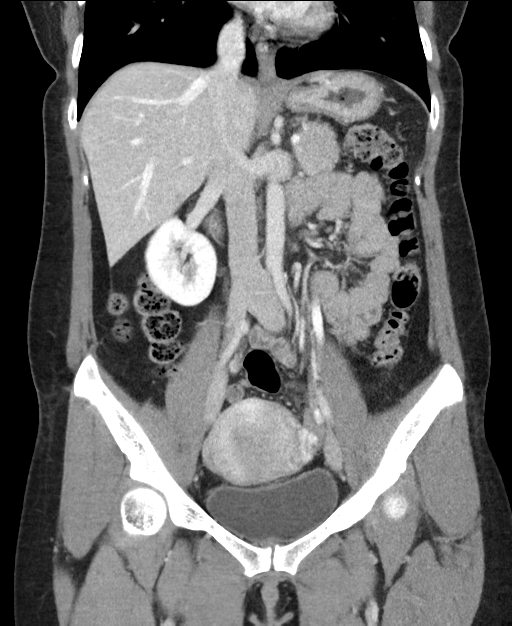
[im 50/90  soft-tissue]
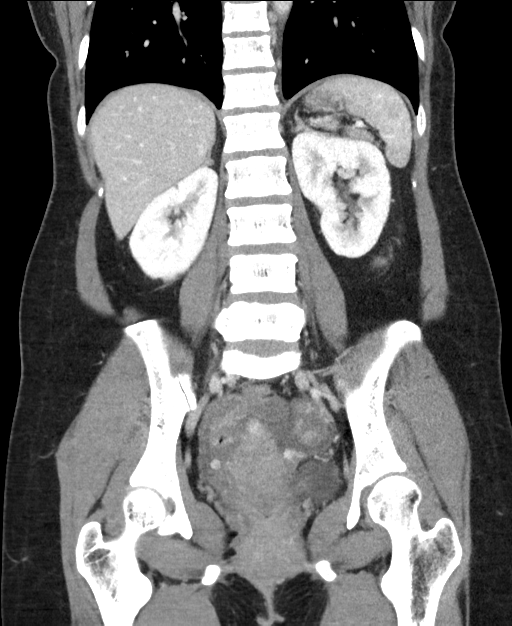

[16 of 46 positions shown; findings below may reference images not displayed]

FINDINGS: Lower chest: Bilateral breast implants are incidentally noted.

Hepatobiliary: Subcentimeter low-attenuation lesion in between
segments 7 and 8 of the liver, too small to characterize, but
statistically likely to represent a tiny cyst. No other suspicious
cystic or solid hepatic lesions. No intra or extrahepatic biliary
ductal dilatation. Gallbladder is normal in appearance.

Pancreas: No pancreatic mass. No pancreatic ductal dilatation. No
pancreatic or peripancreatic fluid collections or inflammatory
changes.

Spleen: Unremarkable.

Adrenals/Urinary Tract: Bilateral kidneys and adrenal glands are
normal in appearance. No hydroureteronephrosis. Urinary bladder is
normal in appearance.

Stomach/Bowel: Normal appearance of the stomach. No pathologic
dilatation of small bowel or colon. Normal appendix.

Vascular/Lymphatic: No significant atherosclerotic disease, aneurysm
or dissection noted in the abdominal or pelvic vasculature. No
lymphadenopathy noted in the abdomen or pelvis.

Reproductive: Right-sided tubal ligation clip noted. Left-sided
tubal ligation clip appears likely dislodged lying dependently in
the cul-de-sac. Tubular structure in the presacral region which
appears likely to emanate from the right adnexa, concerning for
hydrosalpinx, measuring up to 2.9 cm in diameter (axial image 58 of
series 2). Left ovary is grossly unremarkable in appearance. Uterus
is normal in appearance.

Other: Trace volume of free fluid in the cul-de-sac, likely
physiologic. No larger volume of ascites. No pneumoperitoneum.

Musculoskeletal: There are no aggressive appearing lytic or blastic
lesions noted in the visualized portions of the skeleton.
IMPRESSION: 1. Findings are concerning for right-sided hydrosalpinx. Trace
volume of free fluid in the cul-de-sac, likely physiologic. These
findings could be better evaluated with pelvic ultrasound if
clinically appropriate.
2. Normal appendix.

## 2021-08-20 IMAGING — US US ABDOMEN LIMITED RUQ/ASCITES
1 series · 14 of 25 positions shown · non-contrast
Comparison: October 24, 2020.

CLINICAL DATA: Chronic right upper quadrant abdominal pain.

EXAM:
ULTRASOUND ABDOMEN LIMITED RIGHT UPPER QUADRANT

[Series 1: us abdomen limited ruq/ascites · 0.15mm/px · 14 of 54 slices shown]
[im 1/54]
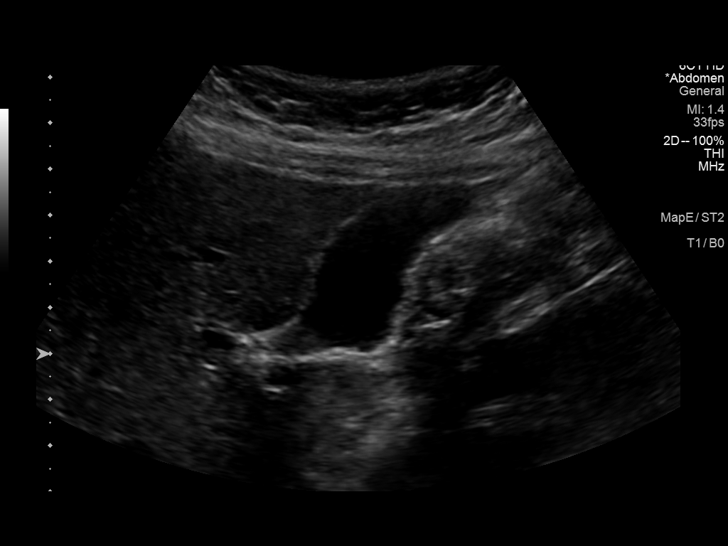
[im 5/54]
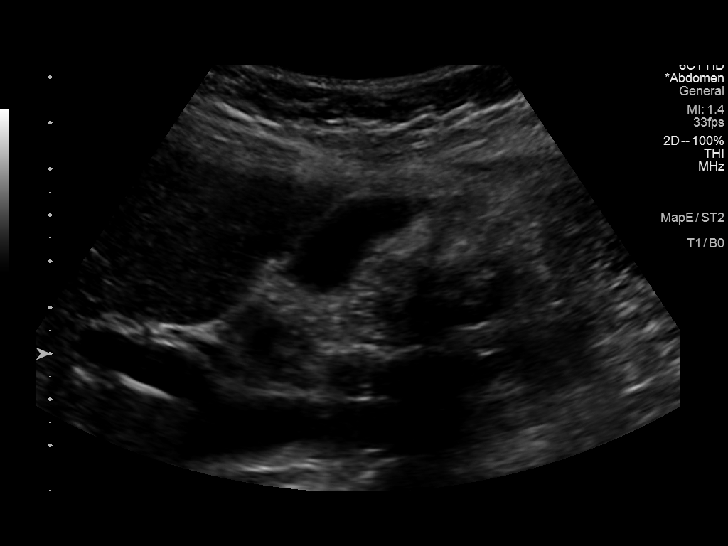
[im 9/54]
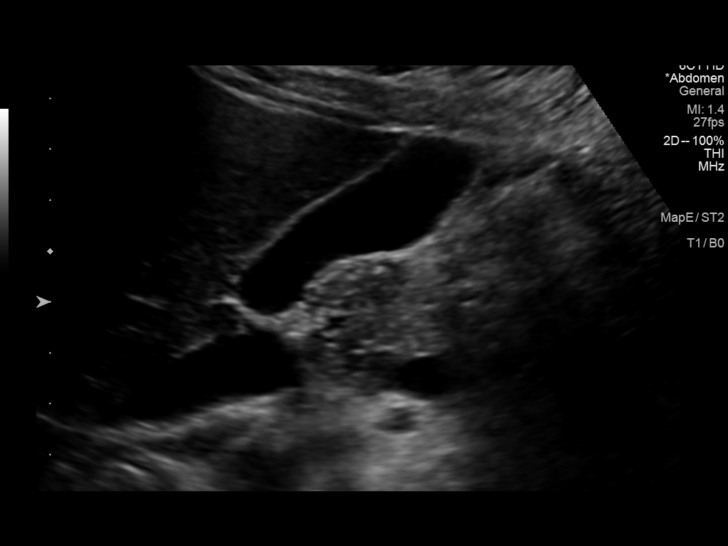
[im 14/54]
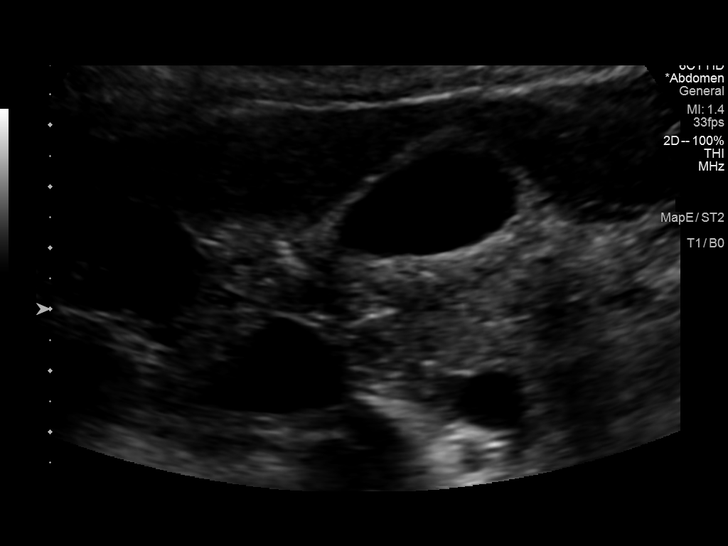
[im 18/54]
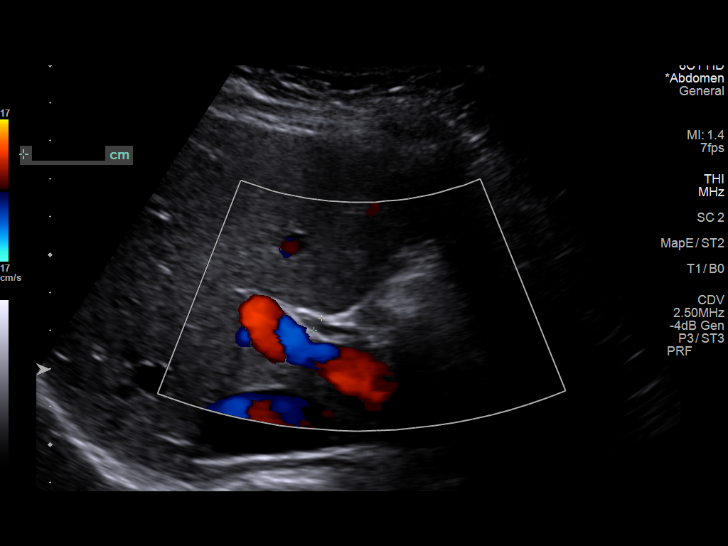
[im 20/54]
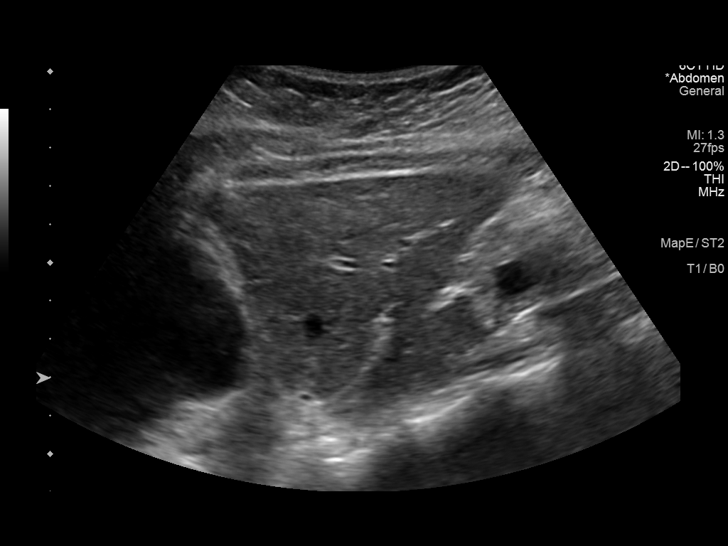
[im 25/54]
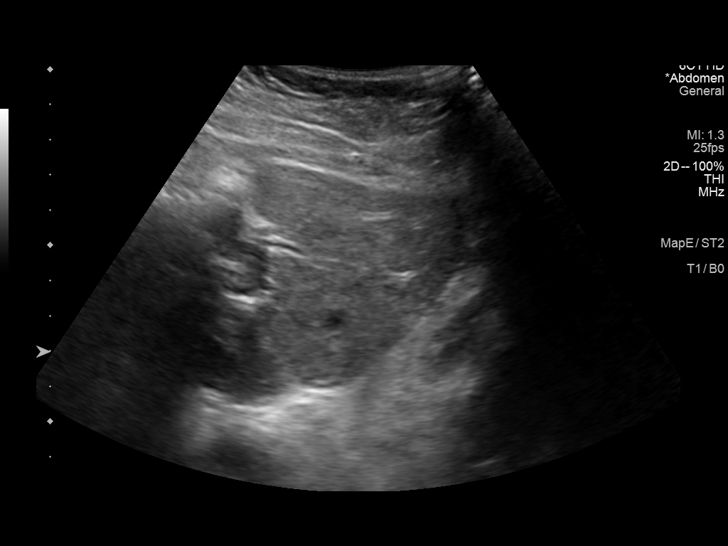
[im 29/54]
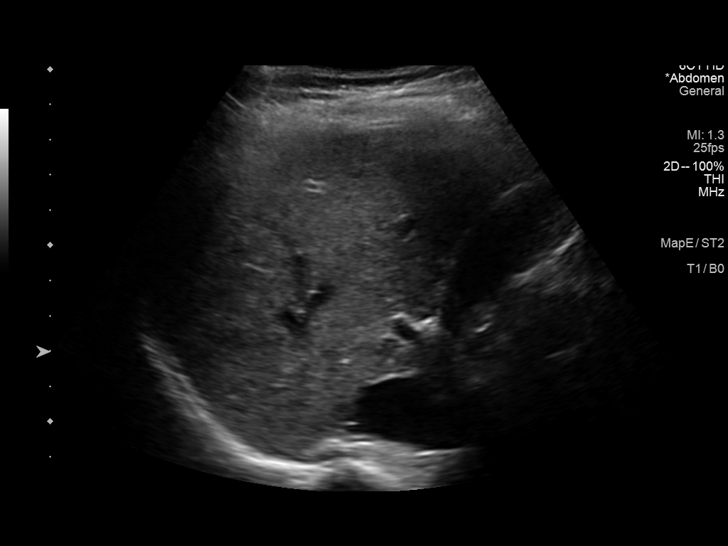
[im 34/54]
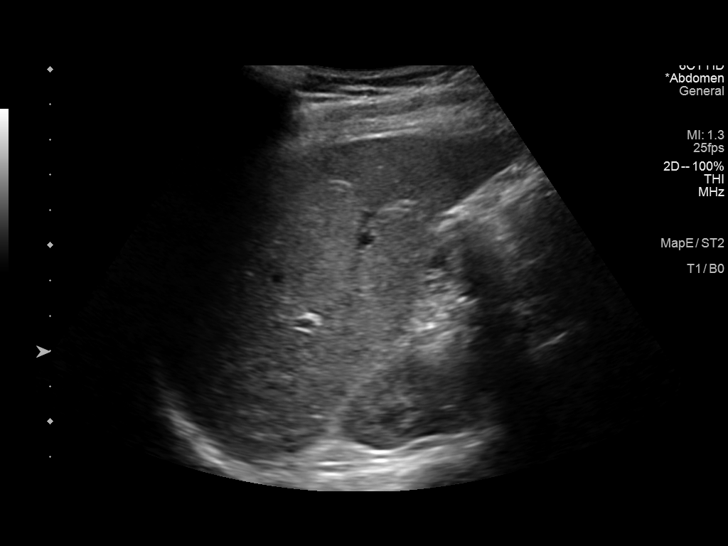
[im 36/54]
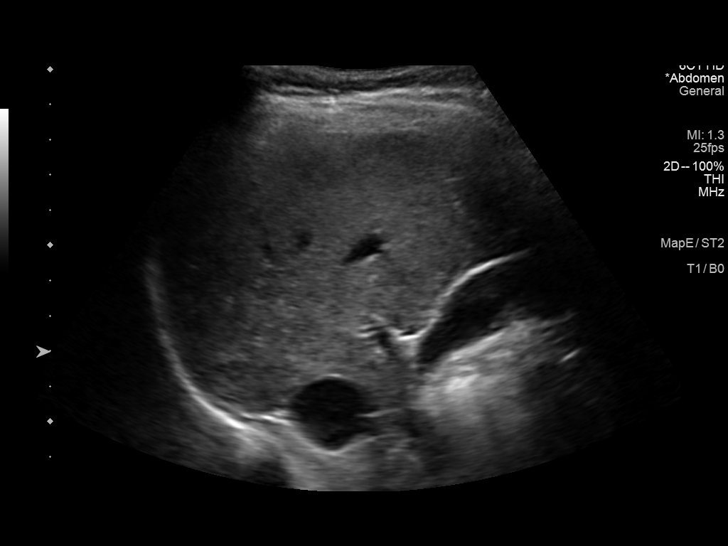
[im 40/54]
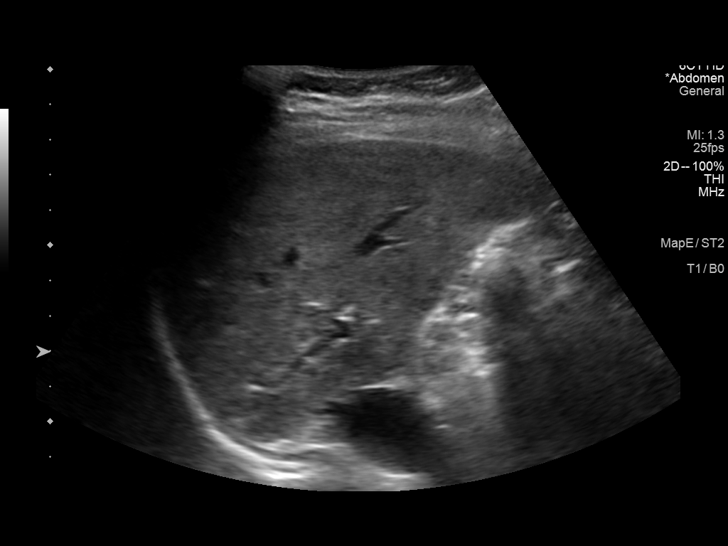
[im 45/54]
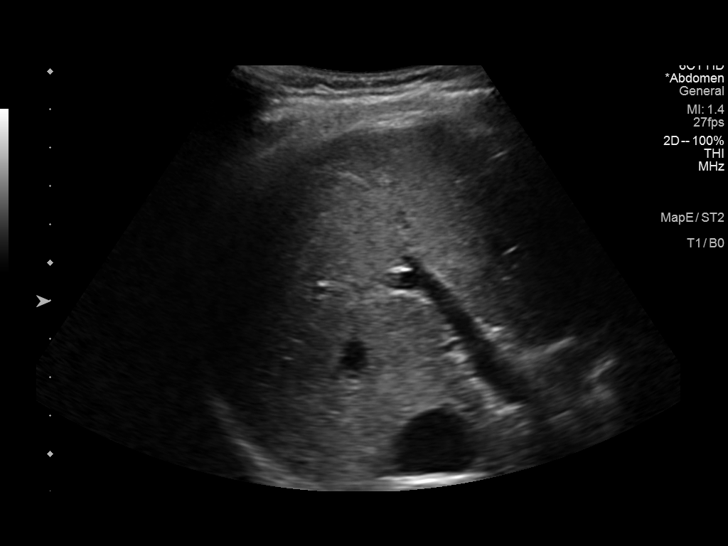
[im 49/54]
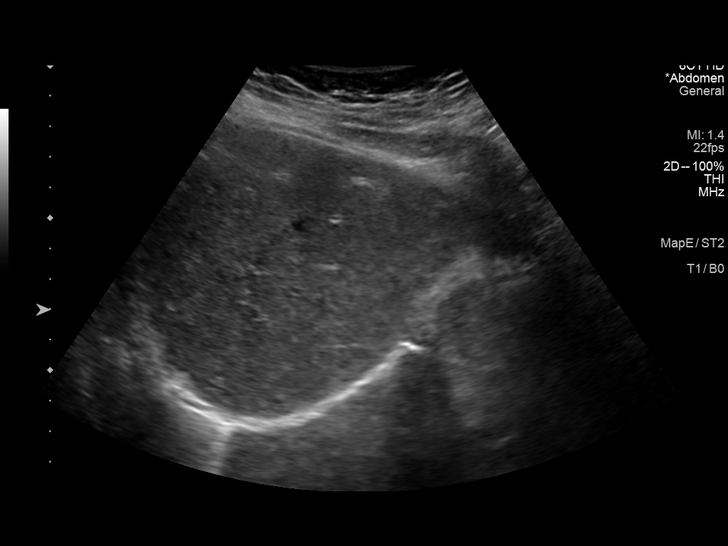
[im 54/54]
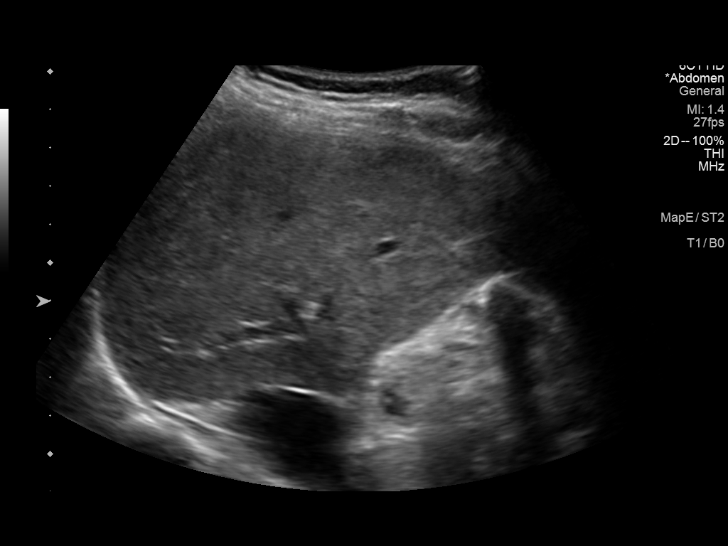

[14 of 25 positions shown; findings below may reference images not displayed]

FINDINGS: Gallbladder:

No gallstones or wall thickening visualized. No sonographic Murphy
sign noted by sonographer.

Common bile duct:

Diameter: 4 mm which is within normal limits.

Liver:

No focal lesion identified. Within normal limits in parenchymal
echogenicity. Portal vein is patent on color Doppler imaging with
normal direction of blood flow towards the liver.

Other: None.
IMPRESSION: No definite abnormality seen in the right upper quadrant of the
abdomen.

## 2021-09-21 ENCOUNTER — Encounter: Payer: Self-pay | Admitting: Internal Medicine

## 2021-09-21 IMAGING — US US BREAST*L* LIMITED INC AXILLA
1 series · 2 of 2 positions shown · non-contrast
Comparison: None.

CLINICAL DATA: 34-year-old female with intermittent focal pain of
the left breast with associated firmness.

EXAM:
DIGITAL DIAGNOSTIC BILATERAL MAMMOGRAM WITH IMPLANTS, CAD AND
TOMOSYNTHESIS; ULTRASOUND LEFT BREAST LIMITED
TECHNIQUE: Bilateral digital diagnostic mammography and breast tomosynthesis
was performed. The images were evaluated with computer-aided
detection. Standard and/or implant displaced views were performed.;
Targeted ultrasound examination of the left breast was performed

[Series 1: us breast*left* limited inc axilla · 0.06mm/px · 2 of 2 slices shown]
[im 1/2]
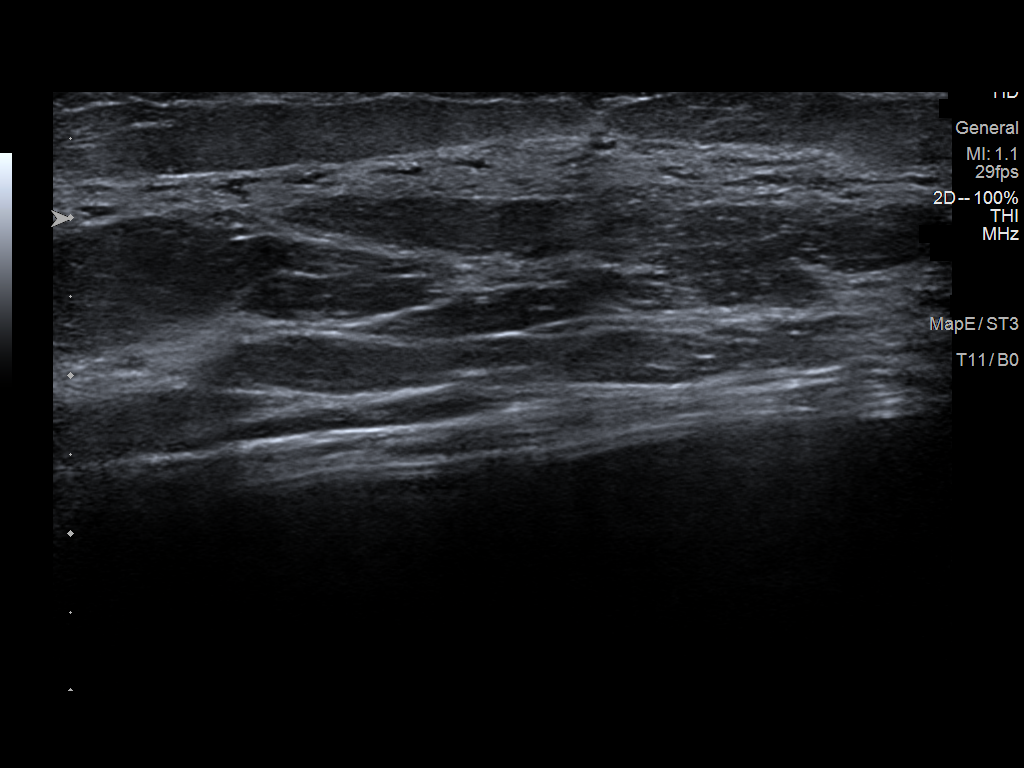
[im 2/2]
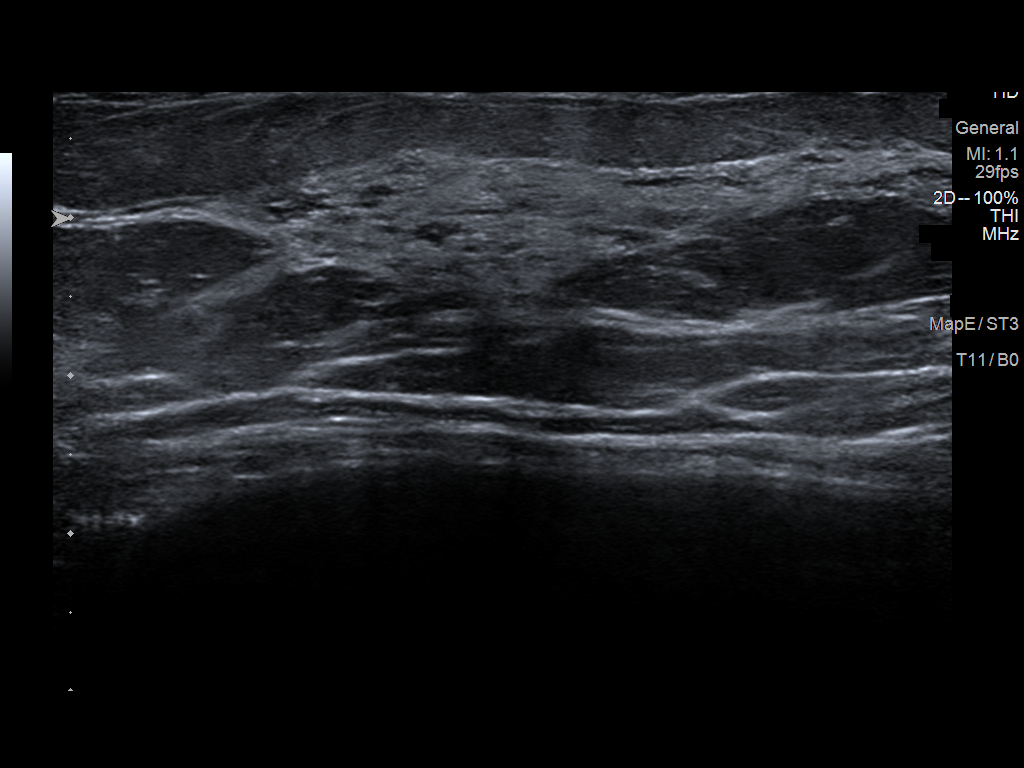

[2 of 2 positions shown; findings below may reference images not displayed]

ACR Breast Density Category c: The breast tissue is heterogeneously
dense, which may obscure small masses.
FINDINGS: No suspicious mammographic findings are identified in either breast.

On physical exam, I palpate no suspicious lumps in the upper left
breast.

The patient has retropectoral implants.

Targeted ultrasound is performed, showing normal fibroglandular
tissue without focal or suspicious sonographic abnormality.
IMPRESSION: 1. No mammographic evidence of malignancy in either breast.
2. Unremarkable sonographic evaluation of the left breast.

RECOMMENDATION:
1. Clinical follow-up recommended for the symptomatic area of
concern in the left breast. Any further workup should be based on
clinical grounds.
2. Screening mammogram at age 39 unless there are persistent or
intervening clinical concerns. (Code:LE-N-PN2)

I have discussed the findings and recommendations with the patient.
If applicable, a reminder letter will be sent to the patient
regarding the next appointment.

BI-RADS CATEGORY  1: Negative.

## 2021-10-05 ENCOUNTER — Encounter: Payer: Self-pay | Admitting: Internal Medicine

## 2021-10-05 ENCOUNTER — Ambulatory Visit (INDEPENDENT_AMBULATORY_CARE_PROVIDER_SITE_OTHER): Payer: 59 | Admitting: Internal Medicine

## 2021-10-05 VITALS — BP 108/70 | HR 83 | Ht 59.0 in | Wt 117.0 lb

## 2021-10-05 DIAGNOSIS — K5900 Constipation, unspecified: Secondary | ICD-10-CM

## 2021-10-05 DIAGNOSIS — K625 Hemorrhage of anus and rectum: Secondary | ICD-10-CM | POA: Diagnosis not present

## 2021-10-05 MED ORDER — PLENVU 140 G PO SOLR: 1.0000 | Freq: Once | ORAL | 0 refills | Status: AC

## 2021-10-05 NOTE — Progress Notes (Signed)
Chief Complaint: Rectal bleeding  HPI : 35 year old female with history of hemorrhoids presents with rectal bleeding.  She has dealt with hemorrhoids for 12 years ago with the birth of her first son. She has tried witch hazel, which has been helpful, and another over-the-counter treatment that has not been effective. Her hemorrhoids have been bothering her again more recently, mostly when she is constipated. She last had rectal bleeding about 2 weeks ago. The rectal bleeding only appears when she wipes, not in the toilet bowl. She has rectal pain when she is constipated. Only occasionally has issues with constipation. On average she has 1 BM per day but when she is constipated, she will have one BM once every 2 days. Has had some lower abodminal discomfort in the past, which improves with having a BM. Has had 2 C-sections in the past. Denies weight loss. Denies fam hx of GI cancers. She has never had a colonoscopy in the past. She went to go see Dr. Cliffton Asters with [facility] who recommended that she get a colonoscopy for further evaluation of her rectal bleeding. She was recommended to start fiber supplements by Dr. Cliffton Asters.  Wt Readings from Last 3 Encounters:  10/05/21 117 lb (53.1 kg)  10/24/20 120 lb (54.4 kg)  11/05/15 146 lb (66.2 kg)    Past Medical History:  Diagnosis Date   Acne    Chicken pox    Hemorrhoids    Medical history non-contributory      Past Surgical History:  Procedure Laterality Date   BREAST ENHANCEMENT SURGERY  2016   CESAREAN SECTION     CESAREAN SECTION WITH BILATERAL TUBAL LIGATION N/A 11/05/2015   Procedure: CESAREAN SECTION WITH BILATERAL TUBAL LIGATION;  Surgeon: Philip Aspen, DO;  Location: [facility];  Service: Obstetrics;  Laterality: N/A;   Family History  Problem Relation Age of Onset   Cancer Mother        breast   Diabetes Mother    Hypertension Father    Colon cancer Neg Hx    Pancreatic cancer Neg Hx    Liver cancer Neg Hx    Esophageal  cancer Neg Hx    Stomach cancer Neg Hx    Social History   Tobacco Use   Smoking status: Never   Smokeless tobacco: Never  Vaping Use   Vaping Use: Never used  Substance Use Topics   Alcohol use: Yes    Alcohol/week: 3.0 standard drinks    Types: 3 Glasses of wine per week   Drug use: No   Current Outpatient Medications  Medication Sig Dispense Refill   Multiple Vitamin (MULTIVITAMIN ADULT PO) Take 1 tablet by mouth daily.     oxyCODONE (OXY IR/ROXICODONE) 5 MG immediate release tablet Take 5 mg by mouth 4 (four) times daily as needed.     Probiotic Product (PROBIOTIC PO) Take 1 tablet by mouth daily.     No current facility-administered medications for this visit.   No Known Allergies   Review of Systems: All systems reviewed and negative except where noted in HPI.   Physical Exam: BP 108/70    Pulse 83    Ht 4\' 11"  (1.499 m)    Wt 117 lb (53.1 kg)    LMP 09/24/2021    SpO2 98%    BMI 23.63 kg/m  Constitutional: Pleasant,well-developed, female in no acute distress. HEENT: Normocephalic and atraumatic. Conjunctivae are normal. No scleral icterus. Cardiovascular: Normal rate, regular rhythm.  Pulmonary/chest: Effort normal and  breath sounds normal. No wheezing, rales or rhonchi. Abdominal: Soft, nondistended, nontender. Bowel sounds active throughout. There are no masses palpable. No hepatomegaly. Extremities: No edema Neurological: Alert and oriented to person place and time. Skin: Skin is warm and dry. No rashes noted. Psychiatric: Normal mood and affect. Behavior is normal.  Labs 10/2020: CBC nml. BMP with mildly low Na of 131. LFTs nml.  ASSESSMENT AND PLAN:  Rectal bleeding Constipation Patient presents with issues with rectal bleeding, and has been noted to have hemorrhoids on anoscopy during her recent surgery clinic visit on 08/17/2021.  She has never had a colonoscopy in the past.  Went over the colonoscopy procedure in detail with the patient.  Will plan to  rule out malignancy and IBD.  She is agreeable to proceed.  To try to increase her likelihood of her having a good prep during colonoscopy, we will have her start MiraLAX when she is feeling more constipated. - Miralax PRN for constipation - Colonoscopy [facility]  Christia Reading, MD

## 2021-10-05 NOTE — Patient Instructions (Signed)
If you are age 36 or older, your body mass index should be between 23-30. Your Body mass index is 23.63 kg/m. If this is out of the aforementioned range listed, please consider follow up with your Primary Care Provider.  If you are age 24 or younger, your body mass index should be between 19-25. Your Body mass index is 23.63 kg/m. If this is out of the aformentioned range listed, please consider follow up with your Primary Care Provider.   ________________________________________________________  The [place] [facility] providers would like to encourage you to use [facility] to communicate with providers for non-urgent requests or questions.  Due to long hold times on the telephone, sending your provider a message by [facility] may be a faster and more efficient way to get a response.  Please allow 48 business hours for a response.  Please remember that this is for non-urgent requests.  _______________________________________________________  Rebecca Rubio have been scheduled for a colonoscopy. Please follow written instructions given to you at your visit today.  Please pick up your prep supplies at the pharmacy within the next 1-3 days. If you use inhalers (even only as needed), please bring them with you on the day of your procedure.  Take 1 capful of Miralax as needed

## 2021-10-07 ENCOUNTER — Encounter: Payer: Self-pay | Admitting: Internal Medicine

## 2021-10-07 ENCOUNTER — Other Ambulatory Visit: Payer: Self-pay

## 2021-10-07 ENCOUNTER — Ambulatory Visit (AMBULATORY_SURGERY_CENTER): Payer: 59 | Admitting: Internal Medicine

## 2021-10-07 VITALS — BP 108/71 | HR 81 | Temp 98.7°F | Resp 15 | Ht 59.0 in | Wt 117.0 lb

## 2021-10-07 DIAGNOSIS — K6289 Other specified diseases of anus and rectum: Secondary | ICD-10-CM | POA: Diagnosis not present

## 2021-10-07 DIAGNOSIS — K648 Other hemorrhoids: Secondary | ICD-10-CM

## 2021-10-07 DIAGNOSIS — K625 Hemorrhage of anus and rectum: Secondary | ICD-10-CM

## 2021-10-07 DIAGNOSIS — K621 Rectal polyp: Secondary | ICD-10-CM | POA: Diagnosis not present

## 2021-10-07 DIAGNOSIS — K649 Unspecified hemorrhoids: Secondary | ICD-10-CM

## 2021-10-07 MED ORDER — SODIUM CHLORIDE 0.9 % IV SOLN
500.0000 mL | Freq: Once | INTRAVENOUS | Status: DC
Start: 2021-10-07 — End: 2021-10-07

## 2021-10-07 MED ORDER — HYDROCORTISONE (PERIANAL) 2.5 % EX CREA: 1.0000 "application " | TOPICAL_CREAM | Freq: Two times a day (BID) | CUTANEOUS | 1 refills | Status: DC

## 2021-10-07 MED ORDER — HYDROCORTISONE (PERIANAL) 2.5 % EX CREA: 1.0000 "application " | TOPICAL_CREAM | Freq: Two times a day (BID) | CUTANEOUS | 1 refills | Status: AC

## 2021-10-07 NOTE — Progress Notes (Signed)
Called to room to assist during endoscopic procedure.  Patient ID and intended procedure confirmed with present staff. Received instructions for my participation in the procedure from the performing physician.  

## 2021-10-07 NOTE — Progress Notes (Signed)
GASTROENTEROLOGY PROCEDURE H&P NOTE   Primary Care Physician: Patient, No Pcp Per (Inactive)    Reason for Procedure:   Rectal bleeding  Plan:    Colonoscopy  Patient is appropriate for endoscopic procedure(s) in the ambulatory ([facility]) setting.  The nature of the procedure, as well as the risks, benefits, and alternatives were carefully and thoroughly reviewed with the patient. Ample time for discussion and questions allowed. The patient understood, was satisfied, and agreed to proceed.     HPI: Rebecca Rubio is a 35 y.o. female who presents for colonoscopy for evaluation of rectal bleeding .  Patient was most recently seen in the Gastroenterology Clinic on 10/05/21.  No interval change in medical history since that appointment. Please refer to that note for full details regarding GI history and clinical presentation.   Past Medical History:  Diagnosis Date   Acne    Chicken pox    Hemorrhoids    Medical history non-contributory     Past Surgical History:  Procedure Laterality Date   BREAST ENHANCEMENT SURGERY  2016   CESAREAN SECTION     CESAREAN SECTION WITH BILATERAL TUBAL LIGATION N/A 11/05/2015   Procedure: CESAREAN SECTION WITH BILATERAL TUBAL LIGATION;  Surgeon: Philip Aspen, DO;  Location: [facility];  Service: Obstetrics;  Laterality: N/A;    Prior to Admission medications   Medication Sig Start Date End Date Taking? Authorizing Provider  Multiple Vitamin (MULTIVITAMIN ADULT PO) Take 1 tablet by mouth daily.   Yes [provider]  Probiotic Product (PROBIOTIC PO) Take 1 tablet by mouth daily.   Yes [provider]  oxyCODONE (OXY IR/ROXICODONE) 5 MG immediate release tablet Take 5 mg by mouth 4 (four) times daily as needed. 08/03/21   [provider]    Current Outpatient Medications  Medication Sig Dispense Refill   Multiple Vitamin (MULTIVITAMIN ADULT PO) Take 1 tablet by mouth daily.     Probiotic Product (PROBIOTIC PO) Take 1  tablet by mouth daily.     oxyCODONE (OXY IR/ROXICODONE) 5 MG immediate release tablet Take 5 mg by mouth 4 (four) times daily as needed.     Current Facility-Administered Medications  Medication Dose Route Frequency Provider Last Rate Last Admin   0.9 %  sodium chloride infusion  500 mL Intravenous Once Imogene Burn, MD        Allergies as of 10/07/2021   (No Known Allergies)    Family History  Problem Relation Age of Onset   Cancer Mother        breast   Diabetes Mother    Hypertension Father    Colon cancer Neg Hx    Pancreatic cancer Neg Hx    Liver cancer Neg Hx    Esophageal cancer Neg Hx    Stomach cancer Neg Hx     Social History   Socioeconomic History   Marital status: Married    Spouse name: Not on file   Number of children: Not on file   Years of education: Not on file   Highest education level: Not on file  Occupational History   Not on file  Tobacco Use   Smoking status: Never   Smokeless tobacco: Never  Vaping Use   Vaping Use: Never used  Substance and Sexual Activity   Alcohol use: Yes    Alcohol/week: 3.0 standard drinks    Types: 3 Glasses of wine per week   Drug use: No   Sexual activity: Not on file  Other Topics  Concern   Not on file  Social History Narrative   Not on file   Social Determinants of Health   Financial Resource Strain: Not on file  Food Insecurity: Not on file  Transportation Needs: Not on file  Physical Activity: Not on file  Stress: Not on file  Social Connections: Not on file  Intimate Partner Violence: Not on file    Physical Exam: Vital signs in last 24 hours: BP 111/76    Pulse 88    Temp 98.7 F (37.1 C) (Temporal)    Resp 13    Ht 4\' 11"  (1.499 m)    Wt 117 lb (53.1 kg)    LMP 09/24/2021    SpO2 99%    BMI 23.63 kg/m  GEN: NAD EYE: Sclerae anicteric ENT: MMM CV: Non-tachycardic Pulm: No increased WOB GI: Soft NEURO:  Alert & Oriented   14/12/2020, MD [facility]   10/07/2021  8:08 AM

## 2021-10-07 NOTE — Op Note (Signed)
[facility] Patient Name: Rebecca Rubio Procedure Date: 10/07/2021 7:58 AM MRN: [id number] Endoscopist: Nicole Kindred "Rebecca Rubio ,  Age: 35 Referring MD:  Date of Birth: [birth year] Gender: Female Account #: [id number] Procedure:                Colonoscopy Indications:              Rectal bleeding Medicines:                Monitored Anesthesia Care Procedure:                Pre-Anesthesia Assessment:                           - Prior to the procedure, a History and Physical                            was performed, and patient medications and                            allergies were reviewed. The patient's tolerance of                            previous anesthesia was also reviewed. The risks                            and benefits of the procedure and the sedation                            options and risks were discussed with the patient.                            All questions were answered, and informed consent                            was obtained. Prior Anticoagulants: The patient has                            taken no previous anticoagulant or antiplatelet                            agents. ASA Grade Assessment: I - A normal, healthy                            patient. After reviewing the risks and benefits,                            the patient was deemed in satisfactory condition to                            undergo the procedure.                           After obtaining informed consent, the colonoscope  was passed under direct vision. Throughout the                            procedure, the patient's blood pressure, pulse, and                            oxygen saturations were monitored continuously. The                            Olympus PCF-H190DL (#[id number]) Colonoscope was                            introduced through the anus and advanced to the the                            terminal ileum. The colonoscopy was performed                             without difficulty. The patient tolerated the                            procedure well. The quality of the bowel                            preparation was good. The terminal ileum, ileocecal                            valve, appendiceal orifice, and rectum were                            photographed. Scope In: 8:11:50 AM Scope Out: 8:34:36 AM Scope Withdrawal Time: 0 hours 10 minutes 24 seconds  Total Procedure Duration: 0 hours 22 minutes 46 seconds  Findings:                 The terminal ileum appeared normal.                           A localized area of erythematous mucosa was found                            in the rectum. Biopsies were taken with a cold                            forceps for histology.                           Non-bleeding internal hemorrhoids were found during                            retroflexion. Complications:            No immediate complications. Estimated Blood Loss:     Estimated blood loss was minimal. Impression:               - The examined portion of the ileum was normal.                           -  Erythematous mucosa in the rectum. Biopsied.                           - Non-bleeding internal hemorrhoids. Recommendation:           - Discharge patient to home (with escort).                           - It is suspected that your rectal bleeding is due                            to hemorrhoids.                           - Start Anusol HC cream twice daily for 7 days.                           - Await pathology results.                           - The findings and recommendations were discussed                            with the patient. Nicole Kindred "Rebecca Rubio,  10/07/2021 8:42:22 AM

## 2021-10-07 NOTE — Patient Instructions (Signed)
Pick up new Rx for Anusol cream, use as directed.  YOU HAD AN ENDOSCOPIC PROCEDURE TODAY AT THE [facility]:   Refer to the procedure report that was given to you for any specific questions about what was found during the examination.  If the procedure report does not answer your questions, please call your gastroenterologist to clarify.  If you requested that your care partner not be given the details of your procedure findings, then the procedure report has been included in a sealed envelope for you to review at your convenience later.  YOU SHOULD EXPECT: Some feelings of bloating in the abdomen. Passage of more gas than usual.  Walking can help get rid of the air that was put into your GI tract during the procedure and reduce the bloating. If you had a lower endoscopy (such as a colonoscopy or flexible sigmoidoscopy) you may notice spotting of blood in your stool or on the toilet paper. If you underwent a bowel prep for your procedure, you may not have a normal bowel movement for a few days.  Please Note:  You might notice some irritation and congestion in your nose or some drainage.  This is from the oxygen used during your procedure.  There is no need for concern and it should clear up in a day or so.  SYMPTOMS TO REPORT IMMEDIATELY:  Following lower endoscopy (colonoscopy or flexible sigmoidoscopy):  Excessive amounts of blood in the stool  Significant tenderness or worsening of abdominal pains  Swelling of the abdomen that is new, acute  Fever of 100F or higher  For urgent or emergent issues, a gastroenterologist can be reached at any hour by calling [phone]. Do not use MyChart messaging for urgent concerns.    DIET:  We do recommend a small meal at first, but then you may proceed to your regular diet.  Drink plenty of fluids but you should avoid alcoholic beverages for 24 hours.  ACTIVITY:  You should plan to take it easy for the rest of today and you should NOT  DRIVE or use heavy machinery until tomorrow (because of the sedation medicines used during the test).    FOLLOW UP: Our staff will call the number listed on your records 48-72 hours following your procedure to check on you and address any questions or concerns that you may have regarding the information given to you following your procedure. If we do not reach you, we will leave a message.  We will attempt to reach you two times.  During this call, we will ask if you have developed any symptoms of COVID 19. If you develop any symptoms (ie: fever, flu-like symptoms, shortness of breath, cough etc.) before then, please call [phone].  If you test positive for Covid 19 in the 2 weeks post procedure, please call and report this information to [place].    If any biopsies were taken you will be contacted by phone or by letter within the next 1-3 weeks.  Please call us at [phone] if you have not heard about the biopsies in 3 weeks.    SIGNATURES/CONFIDENTIALITY: You and/or your care partner have signed paperwork which will be entered into your electronic medical record.  These signatures attest to the fact that that the information above on your After Visit Summary has been reviewed and is understood.  Full responsibility of the confidentiality of this discharge information lies with you and/or your care-partner.

## 2021-10-07 NOTE — Progress Notes (Signed)
VS completed by CW.  Medical and surgical history reviewed and updated.  

## 2021-10-11 ENCOUNTER — Telehealth: Payer: Self-pay | Admitting: *Deleted

## 2021-10-11 ENCOUNTER — Telehealth: Payer: Self-pay

## 2021-10-11 ENCOUNTER — Other Ambulatory Visit: Payer: Self-pay | Admitting: *Deleted

## 2021-10-11 NOTE — Telephone Encounter (Signed)
Attempted followup call, no answer; left VM 

## 2021-10-11 NOTE — Telephone Encounter (Signed)
°  Follow up Call-  Call back number 10/07/2021  Post procedure Call Back phone  # [phone]  Permission to leave phone message Yes  Some recent data might be hidden     Patient questions:  Do you have a fever, pain , or abdominal swelling? No. Pain Score  0 *  Have you tolerated food without any problems? Yes.    Have you been able to return to your normal activities? Yes.    Do you have any questions about your discharge instructions: Diet   No. Medications  No. Follow up visit  No.  Do you have questions or concerns about your Care? No.  Actions: * If pain score is 4 or above: No action needed, pain <4.

## 2021-10-30 ENCOUNTER — Encounter: Payer: Self-pay | Admitting: Internal Medicine
